# Patient Record
Sex: Male | Born: 1945 | Hispanic: No | Marital: Married
Health system: Southern US, Community
[De-identification: ages and names within clinical notes are randomized; demographics above are authoritative.]

## PROBLEM LIST (undated history)

## (undated) DIAGNOSIS — I4891 Unspecified atrial fibrillation: Secondary | ICD-10-CM

## (undated) DIAGNOSIS — N189 Chronic kidney disease, unspecified: Secondary | ICD-10-CM

## (undated) HISTORY — DX: Chronic kidney disease, unspecified: N18.9

## (undated) HISTORY — DX: Unspecified atrial fibrillation: I48.91

---

## 2010-08-01 ENCOUNTER — Encounter: Payer: Self-pay | Admitting: Cardiology

## 2010-08-01 ENCOUNTER — Inpatient Hospital Stay: Payer: Self-pay | Admitting: Internal Medicine

## 2010-08-01 ENCOUNTER — Encounter: Payer: Self-pay | Admitting: Cardiovascular Disease

## 2010-08-05 ENCOUNTER — Encounter: Payer: Self-pay | Admitting: Cardiovascular Disease

## 2010-08-06 ENCOUNTER — Encounter: Payer: Self-pay | Admitting: Cardiovascular Disease

## 2010-08-20 ENCOUNTER — Ambulatory Visit: Payer: Self-pay | Admitting: Cardiology

## 2010-08-20 DIAGNOSIS — I5022 Chronic systolic (congestive) heart failure: Secondary | ICD-10-CM

## 2010-08-20 DIAGNOSIS — I4891 Unspecified atrial fibrillation: Secondary | ICD-10-CM | POA: Insufficient documentation

## 2010-08-25 ENCOUNTER — Encounter: Payer: Self-pay | Admitting: Cardiovascular Disease

## 2010-08-25 ENCOUNTER — Ambulatory Visit: Payer: Self-pay | Admitting: Cardiology

## 2010-08-25 DIAGNOSIS — E785 Hyperlipidemia, unspecified: Secondary | ICD-10-CM | POA: Insufficient documentation

## 2010-08-25 LAB — CONVERTED CEMR LAB: POC INR: 2.3

## 2010-08-28 ENCOUNTER — Ambulatory Visit: Payer: Self-pay | Admitting: Cardiology

## 2010-08-28 ENCOUNTER — Ambulatory Visit: Payer: Self-pay | Admitting: Cardiovascular Disease

## 2010-08-28 ENCOUNTER — Encounter: Payer: Self-pay | Admitting: Cardiology

## 2010-09-02 LAB — CONVERTED CEMR LAB
ALT: 14 units/L (ref 0–53)
Albumin: 3.6 g/dL (ref 3.5–5.2)
Bilirubin, Direct: 0.2 mg/dL (ref 0.0–0.3)
CO2: 22 meq/L (ref 19–32)
Digitoxin Lvl: 2.5 ng/mL — ABNORMAL HIGH (ref 0.8–2.0)
Glucose, Bld: 92 mg/dL (ref 70–99)
Lymphocytes Relative: 39 % (ref 12–46)
Lymphs Abs: 2.9 10*3/uL (ref 0.7–4.0)
Neutro Abs: 3.9 10*3/uL (ref 1.7–7.7)
Neutrophils Relative %: 52 % (ref 43–77)
Platelets: 211 10*3/uL (ref 150–400)
Potassium: 4.5 meq/L (ref 3.5–5.3)
Sodium: 139 meq/L (ref 135–145)
TSH: 6.115 microintl units/mL — ABNORMAL HIGH (ref 0.350–4.500)
Total Bilirubin: 0.9 mg/dL (ref 0.3–1.2)
Total CHOL/HDL Ratio: 4.8
VLDL: 19 mg/dL (ref 0–40)
WBC: 7.5 10*3/uL (ref 4.0–10.5)

## 2010-09-04 ENCOUNTER — Ambulatory Visit: Payer: Self-pay | Admitting: Cardiology

## 2010-09-08 ENCOUNTER — Telehealth: Payer: Self-pay | Admitting: Cardiovascular Disease

## 2010-09-09 ENCOUNTER — Ambulatory Visit: Payer: Self-pay | Admitting: Cardiology

## 2010-09-15 ENCOUNTER — Telehealth: Payer: Self-pay | Admitting: Cardiovascular Disease

## 2010-09-15 ENCOUNTER — Ambulatory Visit: Payer: Self-pay | Admitting: Cardiology

## 2010-09-19 LAB — CONVERTED CEMR LAB
BUN: 18 mg/dL (ref 6–23)
Calcium: 9 mg/dL (ref 8.4–10.5)
Free T4: 0.78 ng/dL — ABNORMAL LOW (ref 0.80–1.80)
Potassium: 4.5 meq/L (ref 3.5–5.3)
Pro B Natriuretic peptide (BNP): 443.3 pg/mL — ABNORMAL HIGH (ref 0.0–100.0)
Sodium: 142 meq/L (ref 135–145)

## 2010-10-06 ENCOUNTER — Encounter: Payer: Self-pay | Admitting: Cardiovascular Disease

## 2010-10-06 ENCOUNTER — Ambulatory Visit: Payer: Self-pay | Admitting: Cardiology

## 2010-10-09 ENCOUNTER — Ambulatory Visit: Payer: Self-pay | Admitting: Cardiology

## 2010-10-09 DIAGNOSIS — R0602 Shortness of breath: Secondary | ICD-10-CM | POA: Insufficient documentation

## 2010-10-09 LAB — CONVERTED CEMR LAB: Free T4: 0.92 ng/dL (ref 0.80–1.80)

## 2010-10-22 ENCOUNTER — Telehealth (INDEPENDENT_AMBULATORY_CARE_PROVIDER_SITE_OTHER): Payer: Self-pay | Admitting: Radiology

## 2010-11-20 ENCOUNTER — Ambulatory Visit: Payer: Self-pay | Admitting: Cardiology

## 2010-12-23 NOTE — Letter (Signed)
Summary: External Other  External Other   Imported By: Harlon Flor 10/06/2010 13:48:09  _____________________________________________________________________  External Attachment:    Type:   Image     Comment:   External Document  Appended Document: External Other Isaac Leonard was in for blood work and informed me that he has stopped his Amiodarone because of the possible side effects, he has also let me know that he isn't taking his coumadin like he should because he "isn't sure about it". I did make sure he knew the risk and he did say that he has been told many times of these risk with not taking the coumadin.

## 2010-12-23 NOTE — Progress Notes (Signed)
Summary: MEDICATION   Phone Note Call from Patient Call back at [phone]   Caller: Patient Call For: [facility] Summary of Call: WOULD LIKE TO KNOW IF ENALIPRIL COULD MAKE HIM HAVE A HEADACHE Initial call taken by: Harlon Flor,  September 15, 2010 3:49 PM  Follow-up for Phone Call        Pt having headaches after taking imdur. Pt is going to not take the medication in the AM and see if his HA improves will call nurse back tomorrow. Benedict Needy, RN  September 15, 2010 5:01 PM   Pt is not having any more HA still taking imdur Follow-up by: Benedict Needy, RN,  September 17, 2010 4:50 PM

## 2010-12-23 NOTE — Letter (Signed)
Summary: [facility] - Echo  [facility] - Echo   Imported By: Marylou Mccoy 09/12/2010 15:15:30  _____________________________________________________________________  External Attachment:    Type:   Image     Comment:   External Document

## 2010-12-23 NOTE — Letter (Signed)
Summary: [facility] - Electrical Cardioversion Orders  [facility] - Electrical Cardioversion Orders   Imported By: Marylou Mccoy 10/15/2010 10:04:22  _____________________________________________________________________  External Attachment:    Type:   Image     Comment:   External Document  Appended Document: Cancelled Myoview Would call him next week and see if he would like to reschedule.  Can do it at [facility] if it would be easier (though better images if he could come to the [place] office).   Appended Document: Cancelled Myoview Called pt to see if he wanted to reschedule. LMOM TCB MES

## 2010-12-23 NOTE — Miscellaneous (Signed)
Summary: Coumadin Clinic  Clinical Lists Changes  Observations: Added new observation of INR POC: 2.3  (08/25/2010 9:41)      PCP: Victorino Dike Walker,M.D. INR POC 2.3  Dietary changes: no     Bleeding/hemorrhagic complications: no     Any changes in medication regimen? no     Any missed doses?: no       Is patient compliant with meds? yes

## 2010-12-23 NOTE — Letter (Signed)
Summary: [facility] Daily Progress Note   [facility] Daily Progress Note   Imported By: Roderic Ovens 09/15/2010 15:59:27  _____________________________________________________________________  External Attachment:    Type:   Image     Comment:   External Document

## 2010-12-23 NOTE — Assessment & Plan Note (Signed)
Summary: NP6/AMD   Visit Type:  Initial Consult Primary Provider:  [name].  CC:  c/o rapid heart  beats and shortness of breath with swelling in ankles..  History of Present Illness: 65 yo with recent admission for atrial fibrillation and acute systolic CHF presents to establish outpatient cardiology followup.  Patient has been spending almost all his time since 2/11 caring for his chronically ill wife.  She is on dialysis, unable to move on her own and morbidly obese.  He noted profound exhaustion beginning a few months ago (starting around 3/11), that he attributed to the difficulty of taking care of all his wife's needs.  He started getting short of breath with exertion.  This had a gradual onset and progression . He was eating very little and getting little sleep.  Beginning about 2 wks prior to admission, he noted the onset of significant lower extremity edema.  He noted his heart beating fast at least 2 wks prior to admission and thinks it could have been longer.  He was admitted earlier this month to [facility] with profound weakness, shortness of breath, and palpitations (the EMS driver who came to take his wife to dialysis insisted that he go to the ER).    In the ER, he was noted to be in atrial fibrillation with rapid response.  He was volume overloaded.  He was admitted and diuresed.  Rate was controlled with digoxin, Coreg, and amiodarone.  Echo showed EF < 25% with global hypokinesis and RV dysfunction.  Since discharge home, he is feeling better.  Orthopnea has resolved.  Appetite is returning.  He can walk about a block's distance now before becoming short of breath.  He is short of breath walking up 5 or 6 steps.  He has never had chest pain.   ECG: atrial fibrillation at 67, nonspecific ST-T changes (looks like digoxin effect)  Labs (9/11): HCT 45.9, plts 199, K 4.7, creatinine 1.45, digoxin 1.6  Current Medications (verified): 1)  Amiodarone Hcl 200 Mg Tabs (Amiodarone  Hcl) .... One Tablet Two Times A Day 2)  Aspir-Low 81 Mg Tbec (Aspirin) .... One Tablet Once Daily 3)  Carvedilol 6.25 Mg Tabs (Carvedilol) .... One Tablet Two Times A Day 4)  Coumadin 7.5 Mg Tabs (Warfarin Sodium) .... Tues, Thurs, Sat. Sun. 5)  Digoxin 0.25 Mg Tabs (Digoxin) .... One Tablet Once Daily 6)  Furosemide 40 Mg Tabs (Furosemide) .... One Tablet Once Daily 7)  Hydralazine Hcl 10 Mg Tabs (Hydralazine Hcl) .... One Tablet Once Daily 8)  Gax-X 9)  Coumadin 5 Mg Tabs (Warfarin Sodium) .... Mon, Wed, and Fri.  Allergies (verified): No Known Drug Allergies  Past History:  Past Medical History: 1. Atrial fibrillation: 1st noted in 9/11.  Now on coumadin.  2. Cardiomyopathy with systolic CHF: Uncertain etiology.  Patient was admitted in 9/11 with CHF exacerbation.  Echo (9/11): EF < 25%, severe global hypokinesis, RV severely dilated with moderate to severely decreased systolic function, mild MR (read by outside cardiologist).   3. CKD.   Past Surgical History: None  Family History: Reviewed history from 08/20/2010 and no changes required. 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  Lives in [place].  Has 1 son in [place].  [employer], was stationed in [place] Tobacco Use - No.  Alcohol Use - no  Review of Systems       All systems reviewed and  negative except as per HPI.   Vital Signs:  Patient profile:   65 year old male Height:      70 inches Weight:      235 pounds BMI:     33.84 Pulse rate:   79 / minute BP sitting:   129 / 93  (left arm) Cuff size:   large  Vitals Entered By: Bishop Dublin, CMA (August 20, 2010 11:20 AM)  Physical Exam  General:  Well developed, well nourished, in no acute distress. Head:  normocephalic and atraumatic Nose:  no deformity, discharge, inflammation, or lesions Mouth:  Teeth,  gums and palate normal. Oral mucosa normal. Neck:  Neck supple, JVP 10 cm. No masses, thyromegaly or abnormal cervical nodes. Lungs:  Clear bilaterally to auscultation and percussion. Heart:  Lateral PMI, chest non-tender; irregular rate and rhythm, S1, S2 without murmurs, rubs or gallops. Carotid upstroke normal, no bruit. Unable to palpate pedal pulses due to edema.  2+ edema to thighs bilaterally.  Abdomen:  Bowel sounds positive; abdomen soft but mildly distended.  Non-tender without masses, organomegaly, or hernias noted. No hepatosplenomegaly. Extremities:  No clubbing or cyanosis. Neurologic:  Alert and oriented x 3. Skin:  Intact without lesions or rashes. Psych:  Normal affect.   Impression & Recommendations:  Problem # 1:  SYSTOLIC HEART FAILURE, CHRONIC (ICD-428.22) Patient had echo showing EF < 25% with global hypokinesis and RV dysfunction.  The etiology of this cardiomyopathy is uncertain.  It certainly could be a tachycardia-mediated cardiomyopathy, with atrial fibrillation/RVR preceding diagnosis for an uncertain period of time.  This would fit with the lack of regional wall motion abnormalities.  Cannot rule out an ischemic cardiomyopathy, though no Qs on ECG and no history of chest pain.  He will need a catheterization eventually.  Could also have idiopathic dilated cardiomyopathy.  He is volume overloaded today, still with NYHA class III symptoms.   - I think that the first step will be cardioversion.  If this is tachycardia-mediated cardiomyopathy, this step will be vital.  He is on coumadin.  Will arrange for TEE-guided DCCV at [facility] next week.  - He is quite volume overloaded but will have to be very careful of creatinine.  Increase Lasix to 40 mg two times a day x 1 week, then decrease to 40 qam, 20 qpm.  Will need BMET in 1 week (will do on the day of DCCV).  - Stop hydralazine and start enalapril 2.5 mg two times a day (low dose).  As above, BMET in 1 week.  - Continue  current dose of Coreg.   - Decrease digoxin to 0.125 mg daily given elevated level.  - Will need left heart cath.  Will plan to do this 1 month after cardioversion when his coumadin can be held for cath.  Will have to follow creatinine carefully.   Problem # 2:  ATRIAL FIBRILLATION (ICD-427.31) Rate controlled atrial fibrillation today.  He is on warfarin.   - Ok to decrease amiodarone to 200 mg daily at this point.  Will get LFTs and TSH with labs next week.  Will need baseline PFTs as well.  - Plan TEE-guided DCCV next week.  Will come to office to get INR prior.  If INR low, will dose with Lovenox.    Other Orders: Trans Esophageal Echo Cardioversion (TEE-Cardioversion)  Patient Instructions: 1)  Your physician recommends that you schedule a follow-up appointment in: 2 weeks 2)  Your physician recommends that you return for a FASTING  lipid profile: OCT 4th (TSH/DIG/BMP/BNPLIP/LFT) 3)  Your physician has recommended you make the following change in your medication: START ENALAPRIL, DECREASE DIGOXIN STOP HYDRALAZINE INCREASE LASIX 4)  Your physician has requested that you have a TEE/Cardioversion.  During a TEE, sound waves are used to create images of your heart. It provides your doctor with information about the size and shape of your heart and how well your heart's chambers and valves are working. In this test, a transducer is attached to the end of a flexible tube that is guided down your throat and into your esophagus (the tube leading from your mouth to your stomach) to get a more detailed image of your heart. Once the TEE has determined that a blood clot is not present, the cardioversion begins.  Electrical cardioversion uses a jolt of electricity to your heart either through paddles or wired patches attached to your chest. This is a controlled, usually prescheduled, procedure. This procedure is done at the hospital and you are not awake during the procedure.  You usually go home the day of  the procedure. Please see the instruction sheet given to you today for more information. Prescriptions: ENALAPRIL MALEATE 2.5 MG TABS (ENALAPRIL MALEATE) Take one tablet by mouth twice a day  #60 x 6   Entered by:   Benedict Needy, RN   Authorized by:   Marca Ancona, MD   Signed by:   Benedict Needy, RN on 08/20/2010   Method used:   Electronically to        [id number] * (retail)       [address]       [place], [place]  [address]       Ph: [phone] or [phone]       Fax: [phone]   RxID:   [id number] FUROSEMIDE 40 MG TABS (FUROSEMIDE) Take 1 tablet by mouth in the AM and 1/2 tab in the afternoon  #45 x 6   Entered by:   Benedict Needy, RN   Authorized by:   Marca Ancona, MD   Signed by:   Benedict Needy, RN on 08/20/2010   Method used:   Electronically to        [id number] (retail)       [address]       [place], [place]  [address]       Ph: [phone] or [phone]       Fax: [phone]   RxID:   [id number] DIGOXIN 0.125 MG TABS (DIGOXIN) Take 1 tablet by mouth once a day  #30 x 4   Entered by:   Benedict Needy, RN   Authorized by:   Marca Ancona, MD   Signed by:   Benedict Needy, RN on 08/20/2010   Method used:   Electronically to        [id number] (retail)       [address]       [place], [place]  [address]       Ph: [phone] or [phone]       Fax: [phone]   RxID:   [id number]

## 2010-12-23 NOTE — Assessment & Plan Note (Signed)
Summary: POST CATH/AMD   Primary Provider:  [name].  CC:  f/u after cardioverson.  History of Present Illness: 65 yo with recent admission for atrial fibrillation and systolic CHF presents for followup.  Patient has been spending almost all his time since 2/11 caring for his chronically ill wife.  She is on dialysis, unable to move on her own and morbidly obese.  He noted profound exhaustion beginning a few months ago (starting around 3/11), that he attributed to the difficulty of taking care of all his wife's needs.  He started getting short of breath with exertion.  This had a gradual onset and progression . He was eating very little and getting little sleep.  Beginning about 2 wks prior to admission in 9/11, he noted the onset of significant lower extremity edema.  He noted his heart beating fast at least 2 wks prior to admission and thinks it could have been longer.  He was admitted earlier this month to [facility] with profound weakness, shortness of breath, and palpitations.   In the ER, he was noted to be in atrial fibrillation with rapid response.  He was volume overloaded.  He was admitted and diuresed.  Rate was controlled with digoxin, Coreg, and amiodarone.  Echo showed EF < 25% with global hypokinesis and RV dysfunction.    After discharge, I set him up for a TEE-guided cardioversion, which was done last week.  TEE did appear to show some improvement in LV systolic function with rate control (EF 35--40%).  He remains in NSR on amiodarone today.  I also restarted him on Lasix.  I stopped digoxin due to elevated level.  Since I last saw him, he has lost 8 lbs.  He is less short of breath.   He still has considerable lower extremity edema.  He is able to climb up his porch steps without problems now and can walk on flat ground without difficulty.  He weed-eated recently with mild dyspnea.    Labs (9/11): HCT 45.9, plts 199, K 4.7, creatinine 1.45, digoxin 1.6 Labs (10/11): HCT 43, K  4.5, creatinine 0.92, LFTs normal, TSH 6.1 (mild increase), LDL 101, HDL 32, BNP 562  ECG: NSR, nonspecific T wave flattening.   Current Medications (verified): 1)  Amiodarone Hcl 200 Mg Tabs (Amiodarone Hcl) .... Take 1 Tablet By Mouth Once A Day 2)  Aspir-Low 81 Mg Tbec (Aspirin) .... One Tablet Once Daily 3)  Carvedilol 6.25 Mg Tabs (Carvedilol) .... One Tablet Two Times A Day 4)  Coumadin 7.5 Mg Tabs (Warfarin Sodium) .... Tues, Thurs, Sat. Sun. 5)  Furosemide 40 Mg Tabs (Furosemide) .... Take 1/2  Tablet By Mouth in The Amn 6)  Gax-X 7)  Coumadin 5 Mg Tabs (Warfarin Sodium) .... Mon, Wed, and Fri. 8)  Enalapril Maleate 2.5 Mg Tabs (Enalapril Maleate) .... Take One Tablet By Mouth Twice A Day 9)  Isosorbide Mononitrate Cr 30 Mg Xr24h-Tab (Isosorbide Mononitrate) .... Take One Tablet By Mouth Daily  Allergies (verified): No Known Drug Allergies  Past History:  Past Surgical History: Last updated: 2010-09-02 None  Family History: Last updated: 02-Sep-2010 Mother: CHF deceased Father: Prostate Cancer;deceased  Social History: Last updated: September 02, 2010 Full Time--pharmaceutical validation engineer Married, wife is chronically ill and requires full-time care.  Lives in [place].  Has 1 son in [place].  [employer], was stationed in [place] Tobacco Use - No.  Alcohol Use - no  Risk Factors: Smoking Status: never (02-Sep-2010)  Past Medical History: 1. Atrial fibrillation:  1st noted in 9/11.  Now on coumadin.  Cardioverted to NSR (08/28/10).  2. Cardiomyopathy with systolic CHF: Uncertain etiology.  Could be tachycardia-mediated.  Patient was admitted in 9/11 with CHF exacerbation.  Echo (9/11): EF < 25%, severe global hypokinesis, RV severely dilated with moderate to severely decreased systolic function, mild MR (read by outside cardiologist).  TEE (10/11): EF 35-40%, no LAA thrombus, RV normal size with mildly decreased systolic function, mild MR.   3. CKD.   Family  History: Reviewed history from 08/20/2010 and no changes required. Mother: CHF deceased Father: Prostate Cancer;deceased  Social History: Reviewed history from 08/20/2010 and no changes required. Full Time--pharmaceutical Wellsite geologist Married, wife is chronically ill and requires full-time care.  Lives in [place].  Has 1 son in [place].  [employer], was stationed in [place] Tobacco Use - No.  Alcohol Use - no  Review of Systems       All systems reviewed and negative except as per HPI.   Vital Signs:  Patient profile:   65 year old male Height:      70 inches Weight:      227 pounds BMI:     32.69 Pulse rate:   54 / minute BP sitting:   126 / 84  (left arm) Cuff size:   large  Vitals Entered By: Benedict Needy, RN (September 04, 2010 2:44 PM)  Physical Exam  General:  Well developed, well nourished, in no acute distress. Neck:  Neck supple, JVP 10 cm. No masses, thyromegaly or abnormal cervical nodes. Lungs:  Clear bilaterally to auscultation and percussion. Heart:  Lateral PMI, chest non-tender; irregular rate and rhythm, S1, S2 without murmurs, rubs or gallops. Carotid upstroke normal, no bruit. Unable to palpate pedal pulses due to edema.  2+ edema to knees bilaterally.  Abdomen:  Bowel sounds positive; abdomen soft and non-tender without masses, organomegaly, or hernias noted. No hepatosplenomegaly. Extremities:  No clubbing or cyanosis. Neurologic:  Alert and oriented x 3. Psych:  Normal affect.   Impression & Recommendations:  Problem # 1:  ATRIAL FIBRILLATION (ICD-427.31) Patient is back in sinus rhythm after DCCV.  Good rate control.  He is taking amiodarone to maintain NSR, which I think is imperative.  He will need to continue coumadin for at least a month post-DVCCV.  On amiodarone, LFTs were normal but TSH was mildly elevated.  Will check free T4 and free T3.  Will need baseline PFTs on amiodarone and yearly eye exam.    Problem # 2:  SYSTOLIC  HEART FAILURE, CHRONIC (ICD-428.22) Patient is feeling better and has lost 8 lbs since last appointment.  He is still quite volume overoladed. EF was 35-40% by TEE (? improvement with rate control of atrial fbrillation).  I am going to increase his Lasix to 40 mg daily and will increase his enalapril to 5 mg two times a day.   Continue same Coreg dosing.  BMET/BNP in 10 days.  We are still not totally sure of the etiology of the patient's CHF.  It may have been tachycardia-mediated.  He also will need a cath to rule out coronary disease.  After he has been on coumadin for 4 wks poste-cardioversion, I will have him hold his coumadin for 5 days and will do a left heart cath.    Followup in 1 month.   Other Orders: Pulmonary Function Test (PFT)  Patient Instructions: 1)  Your physician recommends that you schedule a follow-up appointment in: 1 month  2)  Your physician recommends that you return for lab work in: 10 days (Free t3 and t4,BNP,BMP) 3)  Your physician has recommended you make the following change in your medication: INCREASE furosemide  and enalapril 4)  Your physician has recommended that you have a pulmonary function test.  Pulmonary Function Tests are a group of tests that measure how well air moves in and out of your lungs. Prescriptions: ENALAPRIL MALEATE 5 MG TABS (ENALAPRIL MALEATE) Take one tablet by mouth twice a day  #60 x 3   Entered by:   Benedict Needy, RN   Authorized by:   Marca Ancona, MD   Signed by:   Benedict Needy, RN on 09/04/2010   Method used:   Electronically to        [id number] ([facility]ail)       [address]       [place], [place]  [address]       Ph: [phone] or [phone]       Fax: [phone]   RxID:   [id number]

## 2010-12-23 NOTE — Letter (Signed)
Summary: Cardioversion/TEE Catering manager at [address]   [place], [place] [address]   Phone: [phone]  Fax: [phone]    Cardioversion / TEE Cardioversion Instructions  08/20/2010 MRN: [id number]  Isaac Leonard [address] [place], [place]  [address]  Dear [name], You are scheduled for a Cardioversion / TEE Cardioversion on OCTOBER 6TH with Dr. Mariah Milling.   Please arrive at the [facility]ICAL [facility]ALL AT [facility] at 6:30 a.m. on the day of your procedure.  1)   DIET:  A)   Nothing to eat or drink after midnight except your medications with a sip of water.    B)   Clear liquids include:  water, broth, Sprite, Ginger Ale, black coffee, tea (no sugar),      cranberry / grape / apple juice, jello (not red), popsicle from clear juices (not red).  2)   Come to the [place] office on OCTOBER 3RD AT 10:00 AM for lab work. You do not have to be fasting.  3)   MAKE SURE YOU TAKE YOUR COUMADIN.  4)YOU MAY TAKE ALL of your remaining medications with a small amount of water.  5)  Must have a responsible person to drive you home.  6)   Bring a current list of your medications and current insurance cards.   * Special Note:  Every effort is made to have your procedure done on time. Occasionally there are emergencies that present themselves at the hospital that may cause delays. Please be patient if a delay does occur.  * If you have any questions after you get home, please call the office at [phone].

## 2010-12-23 NOTE — Consult Note (Signed)
Summary[facility], [facility]   [facility]   Imported By: Roderic Ovens 09/15/2010 15:59:47  _____________________________________________________________________  External Attachment:    Type:   Image     Comment:   External Document

## 2010-12-23 NOTE — Assessment & Plan Note (Signed)
Summary: F/U 1 MONTH   Visit Type:  Follow-up Primary Isaac Leonard:  Isaac Leonard.  CC:  He is very concerned about taking the Amiodarone.  Denies chest pain or shortness of breath..  History of Present Illness: 65 yo with recent admission for atrial fibrillation and systolic CHF presents for followup.  Patient has been spending almost all his time since 2/11 caring for his chronically ill wife.  She is on dialysis, unable to move on her own and morbidly obese.  He noted profound exhaustion beginning a few months ago (starting around 3/11), that he attributed to the difficulty of taking care of all his wife's needs.  He started getting short of breath with exertion.  This had a gradual onset and progression . He was eating very little and getting little sleep.  Beginning about 2 wks prior to admission in 9/11, he noted the onset of significant lower extremity edema.  He noted his heart beating fast at least 2 wks prior to admission and thinks it could have been longer. He was admitted in 9/11 with systolic CHF and atrial fibrillation with rapid response.  Echo showed EF < 25% with global hypokinesis and RV dysfunction.    Since discharge from that admission, he has undergone TEE-guided cardioversion.  He was started on a regimen to treat CHF.  He remains in NSR today.  He has stopped amiodarone because of tremors that he developed in his hands.  He stopped coumadin because he remembers his wife having a hard time with it.  He has stopped Lasix because he is not longer short of breath.  In general, he is feeling quite good and thinks that he is back to normal.  He is walking, climbing stairs, etc without dyspnea, which is a considerable improvement for him.  He has never had chest pain.  Weight is down 5 lbs since last appointment.   Labs (9/11): HCT 45.9, plts 199, K 4.7, creatinine 1.45, digoxin 1.6 Labs (10/11): HCT 43, K 4.5, creatinine 0.92, LFTs normal, TSH 6.1 (mild increase), LDL 101, HDL 32,  BNP 562 => 443 Labs (11/11): Free T4 and TSH normal  ECG: NSR, normal  Current Medications (verified): 1)  Aspir-Low 81 Mg Tbec (Aspirin) .... One Tablet Once Daily 2)  Carvedilol 6.25 Mg Tabs (Carvedilol) .... One Tablet Two Times A Day 3)  Gax-X 4)  Enalapril Maleate 5 Mg Tabs (Enalapril Maleate) .... Take One Tablet By Mouth Twice A Day 5)  Isosorbide Mononitrate Cr 30 Mg Xr24h-Tab (Isosorbide Mononitrate) .... Take One Tablet By Mouth Daily  Allergies (verified): No Known Drug Allergies  Past History:  Past Surgical History: Last updated: 08/29/2010 None  Family History: Last updated: 08/29/2010 Mother: CHF deceased Father: Prostate Cancer;deceased  Social History: Last updated: 08-29-2010 Full Time--pharmaceutical validation engineer Married, wife is chronically ill and requires full-time care.  Lives in [place].  Has 1 son in [place].  [employer], was stationed in [place] Tobacco Use - No.  Alcohol Use - no  Risk Factors: Smoking Status: never (08-29-2010)  Past Medical History: Reviewed history from 09/04/2010 and no changes required. 1. Atrial fibrillation: 1st noted in 9/11.  Now on coumadin.  Cardioverted to NSR (08/28/10).  2. Cardiomyopathy with systolic CHF: Uncertain etiology.  Could be tachycardia-mediated.  Patient was admitted in 9/11 with CHF exacerbation.  Echo (9/11): EF < 25%, severe global hypokinesis, RV severely dilated with moderate to severely decreased systolic function, mild MR (read by outside cardiologist).  TEE (10/11): EF 35-40%,  no LAA thrombus, RV normal size with mildly decreased systolic function, mild MR.   3. CKD.   Family History: Reviewed history from 08/20/2010 and no changes required. Mother: CHF deceased Father: Prostate Cancer;deceased  Social History: Reviewed history from 08/20/2010 and no changes required. Full Time--pharmaceutical Wellsite geologist Married, wife is chronically ill and requires full-time care.   Lives in [place].  Has 1 son in [place].  [employer], was stationed in [place] Tobacco Use - No.  Alcohol Use - no  Review of Systems       All systems reviewed and negative except as per HPI.   Vital Signs:  Patient profile:   65 year old male Height:      70 inches Weight:      222 pounds BMI:     31.97 Pulse rate:   70 / minute BP sitting:   148 / 90  (left arm) Cuff size:   large  Vitals Entered By: Bishop Dublin, CMA (October 09, 2010 2:06 PM)  Physical Exam  General:  Well developed, well nourished, in no acute distress. Neck:  Neck supple, no JVD. No masses, thyromegaly or abnormal cervical nodes. Lungs:  Clear bilaterally to auscultation and percussion. Heart:  Lateral PMI, chest non-tender; regular rate and rhythm, S1, S2 without murmurs, rubs or gallops. Carotid upstroke normal, no bruit. Unable to palpate pedal pulses due to edema. 1+ edema 1/2 to knees bilaterally.  Abdomen:  Bowel sounds positive; abdomen soft and non-tender without masses, organomegaly, or hernias noted. No hepatosplenomegaly. Extremities:  No clubbing or cyanosis. Neurologic:  Alert and oriented x 3. Psych:  Normal affect.   Impression & Recommendations:  Problem # 1:  SYSTOLIC HEART FAILURE, CHRONIC (ICD-428.22) Patient is feeling better and has lost 5 lbs since last appointment.  Volume status appears better today with no JVD and at most NYHA class II symptoms.  His systolic dysfunction may have been tachycardia-mediated.  However, we have not ruled out CAD (thought never had chest pain).  I discussed catheterization again with him today.  He strongly wants to avoid invasive procedure.  Given his functional improvement, his presentation may have been due to tachycardia-mediated cardiomyopathy.  I will get a repeat TTE and an ETT-myoview.  If EF has made significant improvement and myoview shows no perfusion abnormalities, likely that this was tachy-mediated CMP.  If EF is still low or  there are perfusion abnormalities on myoview, he should have catheterization.   His volume status looks very good today and I agree with him that Lasix can be used on an as-needed basis.  He will weigh himself and take Lasix if he gains more than 2-3 lbs in a week.  Continue to watch salt.  I am also going to have him increase Coreg to 12.5 mg two times a day, continuing the same dose of enalapril.  If EF is still low on echo, will need to get him on spironolactone eventually.   Problem # 2:  ATRIAL FIBRILLATION (ICD-427.31) Patient remains in sinus rhythm after DCCV.  He has stopped amiodarone due to tremors and concerns about other possible side effects, which I think is reasonable.  However, I think that it is imperative to keep him out of atrial fibrillation if at all possible, as his systolic dysfunction may have been due to tachycardia-mediated cardiomyopathy.  If he has recurrence of atrial fibrillation, I would consider setting him up for atrial fibrillation ablation.  He will continue Coreg and agrees to restart on  coumadin for CVA prevention.   Other Orders: Nuclear Stress Test (Nuc Stress Test) Echocardiogram (Echo)  Patient Instructions: 1)  Your physician recommends that you schedule a follow-up appointment in:  1 month Dr Shirlee Latch,  1 week [facility] 2)  Your physician has recommended you make the following change in your medication: Increase Carvedilol to 12.5mg  two times a day, Restart Coumadin 5mg  once daily with an INR goal of 2.0-2.5, and Start Furosemide 20-40mg  once daily as needed for 2-3 lb weight gain. 3)  Your physician has requested that you have an echocardiogram.  Echocardiography is a painless test that uses sound waves to create images of your heart. It provides your doctor with information about the size and shape of your heart and how well your heart's chambers and valves are working.  This procedure takes approximately one hour. There are no restrictions for this  procedure. 4)  Your physician has requested that you have an exercise stress myoview.  For further information please visit [email or web address]  Please follow instruction sheet, as given. Prescriptions: CARVEDILOL 12.5 MG TABS (CARVEDILOL) Take one tablet by mouth twice a day  #60 x 6   Entered by:   Cloyde Reams RN   Authorized by:   Marca Ancona, MD   Signed by:   Cloyde Reams RN on 10/09/2010   Method used:   Electronically to        [id number] * (retail)       [address]       [place], [place]  [address]       Ph: [phone] or [phone]       Fax: [phone]   RxID:   [id number] FUROSEMIDE 20 MG TABS (FUROSEMIDE) Take 1-2  tablets by mouth daily as needed for 2-3 lb weight gain  #60 x 3   Entered by:   Cloyde Reams RN   Authorized by:   Marca Ancona, MD   Signed by:   Cloyde Reams RN on 10/09/2010   Method used:   Electronically to        [id number] * (retail)       [address]       [place], [place]  [address]       Ph: [phone] or [phone]       Fax: [phone]   RxID:   [id number]

## 2010-12-23 NOTE — Progress Notes (Signed)
Summary: RX  Phone Note Refill Request Call back at Home Phone [phone] Message from:  Patient on September 08, 2010 11:53 AM  Refills Requested: Medication #1:  CARVEDILOL 6.25 MG TABS one tablet two times a day  Medication #2:  ISOSORBIDE MONONITRATE CR 30 MG XR24H-TAB Take one tablet by mouth daily. Initial call taken by: Harlon Flor,  September 08, 2010 11:54 AM    Prescriptions: ISOSORBIDE MONONITRATE CR 30 MG XR24H-TAB (ISOSORBIDE MONONITRATE) Take one tablet by mouth daily  #30 x 6   Entered by:   Bishop Dublin, CMA   Authorized by:   Dossie Arbour MD   Signed by:   Bishop Dublin, CMA on 09/08/2010   Method used:   Electronically to        [id number] (retail)       [address]       [place], [place]  [address]       Ph: [phone] or [phone]       Fax: [phone]   RxID:   [id number] CARVEDILOL 6.25 MG TABS (CARVEDILOL) one tablet two times a day  #60 x 6   Entered by:   Bishop Dublin, CMA   Authorized by:   Dossie Arbour MD   Signed by:   Bishop Dublin, CMA on 09/08/2010   Method used:   Electronically to        [id number] (retail)       [address]       [place], [place]  [address]       Ph: [phone] or [phone]       Fax: [phone]   RxID:   [id number]

## 2010-12-23 NOTE — Letter (Signed)
[facility]   [facility]   Imported By: Roderic Ovens 09/15/2010 15:38:58  _____________________________________________________________________  External Attachment:    Type:   Image     Comment:   External Document

## 2010-12-25 NOTE — Progress Notes (Signed)
Summary: Cancelled Myoview  Phone Note Call from Patient   Caller: Patient Action Taken: Phone Call Completed Summary of Call: Isaac Leonard called and cancelled the Myoview sched for 12/08 due to health issues with his wife. He did not want to resched at this time. Initial call taken by: Leonia Corona, RT-N,  October 22, 2010 1:28 PM     Appended Document: Cancelled Myoview Would call him next week and see if he would like to reschedule.  Can do it at [facility] if it would be easier (though better images if he could come to the [place] office).   Appended Document: Cancelled Myoview Called pt to see if he wanted to reschedule. LMOM TCB MES  Appended Document: Cancelled Myoview Attempted to call pt again, LMOM TCB /MES

## 2010-12-25 NOTE — Letter (Signed)
Summary: [facility]-Transesophageal Echocardiogram Orders  [facility]   Imported By: Marylou Mccoy 10/15/2010 10:36:08  _____________________________________________________________________  External Attachment:    Type:   Image     Comment:   External Document

## 2010-12-25 NOTE — Miscellaneous (Signed)
Summary: [facility]/ Consent for TEE  [facility]/Echo   Imported By: Lenard Forth 11/19/2010 13:07:58  _____________________________________________________________________  External Attachment:    Type:   Image     Comment:   External Document

## 2011-06-07 ENCOUNTER — Other Ambulatory Visit: Payer: Self-pay | Admitting: Cardiology

## 2011-06-16 ENCOUNTER — Encounter: Payer: Self-pay | Admitting: Cardiology

## 2011-12-10 ENCOUNTER — Other Ambulatory Visit: Payer: Self-pay | Admitting: Cardiology

## 2012-05-11 ENCOUNTER — Inpatient Hospital Stay: Payer: Self-pay | Admitting: Internal Medicine

## 2012-05-11 LAB — URINALYSIS, COMPLETE
Bilirubin,UR: NEGATIVE
Glucose,UR: 50 mg/dL (ref 0–75)
Protein: >=500
RBC,UR: 2 /HPF (ref 0–5)
Specific Gravity: 1.018 (ref 1.003–1.030)
Squamous Epithelial: NONE SEEN
WBC UR: 84 /HPF (ref 0–5)

## 2012-05-11 LAB — CBC
HCT: 49.3 % (ref 40.0–52.0)
Platelet: 155 10*3/uL (ref 150–440)
WBC: 14.4 10*3/uL — ABNORMAL HIGH (ref 3.8–10.6)

## 2012-05-11 LAB — CK TOTAL AND CKMB (NOT AT ARMC): CK-MB: 1.7 ng/mL (ref 0.5–3.6)

## 2012-05-11 LAB — COMPREHENSIVE METABOLIC PANEL
Albumin: 4 g/dL (ref 3.4–5.0)
BUN: 21 mg/dL — ABNORMAL HIGH (ref 7–18)
Bilirubin,Total: 2.2 mg/dL — ABNORMAL HIGH (ref 0.2–1.0)
Calcium, Total: 8.9 mg/dL (ref 8.5–10.1)
EGFR (Non-African Amer.): 41 — ABNORMAL LOW
Glucose: 163 mg/dL — ABNORMAL HIGH (ref 65–99)
Osmolality: 284 (ref 275–301)
SGOT(AST): 38 U/L — ABNORMAL HIGH (ref 15–37)
SGPT (ALT): 38 U/L
Total Protein: 7.1 g/dL (ref 6.4–8.2)

## 2012-05-11 LAB — PROTIME-INR: Prothrombin Time: 14.7 secs (ref 11.5–14.7)

## 2012-05-12 DIAGNOSIS — I4891 Unspecified atrial fibrillation: Secondary | ICD-10-CM

## 2012-05-12 LAB — LIPID PANEL
Cholesterol: 171 mg/dL (ref 0–200)
HDL Cholesterol: 28 mg/dL — ABNORMAL LOW (ref 40–60)
Ldl Cholesterol, Calc: 125 mg/dL — ABNORMAL HIGH (ref 0–100)
Triglycerides: 90 mg/dL (ref 0–200)

## 2012-05-12 LAB — TROPONIN I
Troponin-I: 0.39 ng/mL — ABNORMAL HIGH
Troponin-I: 0.42 ng/mL — ABNORMAL HIGH

## 2012-05-12 LAB — HEMOGLOBIN A1C: Hemoglobin A1C: 6.3 % (ref 4.2–6.3)

## 2012-05-12 LAB — DIGOXIN LEVEL: Digoxin: <0.06 ng/mL

## 2012-05-13 LAB — BASIC METABOLIC PANEL
Anion Gap: 9 (ref 7–16)
BUN: 20 mg/dL — ABNORMAL HIGH (ref 7–18)
Calcium, Total: 8.5 mg/dL (ref 8.5–10.1)
Chloride: 106 mmol/L (ref 98–107)
EGFR (African American): 53 — ABNORMAL LOW
EGFR (Non-African Amer.): 46 — ABNORMAL LOW
Glucose: 143 mg/dL — ABNORMAL HIGH (ref 65–99)
Osmolality: 284 (ref 275–301)
Sodium: 140 mmol/L (ref 136–145)

## 2012-05-13 LAB — LIPID PANEL
Cholesterol: 169 mg/dL (ref 0–200)
HDL Cholesterol: 29 mg/dL — ABNORMAL LOW (ref 40–60)
Triglycerides: 99 mg/dL (ref 0–200)
VLDL Cholesterol, Calc: 20 mg/dL (ref 5–40)

## 2012-05-13 LAB — MAGNESIUM: Magnesium: 2 mg/dL

## 2012-05-13 LAB — PHOSPHORUS: Phosphorus: 2.6 mg/dL (ref 2.5–4.9)

## 2012-05-14 LAB — CBC WITH DIFFERENTIAL/PLATELET
Eosinophil #: 0 10*3/uL (ref 0.0–0.7)
Eosinophil %: 0.3 %
HGB: 14.9 g/dL (ref 13.0–18.0)
Lymphocyte #: 2.7 10*3/uL (ref 1.0–3.6)
MCH: 29.1 pg (ref 26.0–34.0)
MCHC: 33.2 g/dL (ref 32.0–36.0)
MCV: 88 fL (ref 80–100)
Monocyte %: 7.6 %
Neutrophil #: 8.5 10*3/uL — ABNORMAL HIGH (ref 1.4–6.5)
Neutrophil %: 69.4 %
Platelet: 137 10*3/uL — ABNORMAL LOW (ref 150–440)
RDW: 15.1 % — ABNORMAL HIGH (ref 11.5–14.5)

## 2012-05-14 LAB — BASIC METABOLIC PANEL
BUN: 23 mg/dL — ABNORMAL HIGH (ref 7–18)
Co2: 25 mmol/L (ref 21–32)
Creatinine: 1.86 mg/dL — ABNORMAL HIGH (ref 0.60–1.30)
EGFR (African American): 43 — ABNORMAL LOW
EGFR (Non-African Amer.): 37 — ABNORMAL LOW
Glucose: 82 mg/dL (ref 65–99)
Potassium: 3.4 mmol/L — ABNORMAL LOW (ref 3.5–5.1)

## 2012-05-14 LAB — PROTIME-INR: INR: 1.2

## 2012-05-15 LAB — CK TOTAL AND CKMB (NOT AT ARMC): CK, Total: 1896 U/L — ABNORMAL HIGH (ref 35–232)

## 2012-05-15 LAB — PROTIME-INR: INR: 1.2

## 2012-05-16 ENCOUNTER — Other Ambulatory Visit (HOSPITAL_COMMUNITY): Payer: Medicare Other

## 2012-05-16 ENCOUNTER — Ambulatory Visit (HOSPITAL_COMMUNITY)
Admission: AD | Admit: 2012-05-16 | Discharge: 2012-05-16 | Disposition: A | Discharge status: Home / Self Care | Payer: Self-pay | Source: Other Acute Inpatient Hospital | Attending: Internal Medicine | Admitting: Internal Medicine

## 2012-05-16 ENCOUNTER — Inpatient Hospital Stay
Admission: RE | Admit: 2012-05-16 | Discharge: 2012-06-27 | Disposition: A | Discharge status: Skilled Nursing Facility | Payer: Medicare Other | Source: Ambulatory Visit | Attending: Internal Medicine | Admitting: Internal Medicine

## 2012-05-16 DIAGNOSIS — I509 Heart failure, unspecified: Secondary | ICD-10-CM | POA: Insufficient documentation

## 2012-05-16 DIAGNOSIS — I5022 Chronic systolic (congestive) heart failure: Secondary | ICD-10-CM

## 2012-05-16 DIAGNOSIS — I6350 Cerebral infarction due to unspecified occlusion or stenosis of unspecified cerebral artery: Secondary | ICD-10-CM | POA: Insufficient documentation

## 2012-05-16 DIAGNOSIS — A419 Sepsis, unspecified organism: Secondary | ICD-10-CM

## 2012-05-16 DIAGNOSIS — N390 Urinary tract infection, site not specified: Secondary | ICD-10-CM

## 2012-05-16 DIAGNOSIS — R6521 Severe sepsis with septic shock: Secondary | ICD-10-CM

## 2012-05-16 DIAGNOSIS — E785 Hyperlipidemia, unspecified: Secondary | ICD-10-CM

## 2012-05-16 DIAGNOSIS — J9600 Acute respiratory failure, unspecified whether with hypoxia or hypercapnia: Secondary | ICD-10-CM | POA: Insufficient documentation

## 2012-05-16 DIAGNOSIS — J811 Chronic pulmonary edema: Secondary | ICD-10-CM

## 2012-05-16 DIAGNOSIS — R0602 Shortness of breath: Secondary | ICD-10-CM

## 2012-05-16 DIAGNOSIS — R0902 Hypoxemia: Secondary | ICD-10-CM

## 2012-05-16 DIAGNOSIS — J9620 Acute and chronic respiratory failure, unspecified whether with hypoxia or hypercapnia: Secondary | ICD-10-CM

## 2012-05-16 DIAGNOSIS — I4891 Unspecified atrial fibrillation: Secondary | ICD-10-CM

## 2012-05-16 LAB — BLOOD GAS, ARTERIAL
Acid-base deficit: 4.1 mmol/L — ABNORMAL HIGH (ref 0.0–2.0)
FIO2: 45 %
MECHVT: 550 mL
O2 Saturation: 97.6 %
PEEP: 5 cmH2O
Patient temperature: 98.6
RATE: 12 resp/min
pO2, Arterial: 107 mmHg — ABNORMAL HIGH (ref 80.0–100.0)

## 2012-05-16 LAB — COMPREHENSIVE METABOLIC PANEL
ALT: 34 U/L (ref 0–53)
Alkaline Phosphatase: 126 U/L — ABNORMAL HIGH (ref 39–117)
CO2: 22 mEq/L (ref 19–32)
GFR calc Af Amer: 52 mL/min — ABNORMAL LOW (ref >90)
GFR calc non Af Amer: 45 mL/min — ABNORMAL LOW (ref >90)
Glucose, Bld: 168 mg/dL — ABNORMAL HIGH (ref 70–99)
Potassium: 4.4 mEq/L (ref 3.5–5.1)
Sodium: 140 mEq/L (ref 135–145)

## 2012-05-16 LAB — URINE CULTURE

## 2012-05-16 LAB — DIFFERENTIAL
Lymphocytes Relative: 15 % (ref 12–46)
Lymphs Abs: 1.7 10*3/uL (ref 0.7–4.0)
Neutro Abs: 9.2 10*3/uL — ABNORMAL HIGH (ref 1.7–7.7)
Neutrophils Relative %: 77 % (ref 43–77)

## 2012-05-16 LAB — CBC
Platelets: 153 10*3/uL (ref 150–400)
RBC: 5.4 MIL/uL (ref 4.22–5.81)
WBC: 11.9 10*3/uL — ABNORMAL HIGH (ref 4.0–10.5)

## 2012-05-16 LAB — BASIC METABOLIC PANEL
Anion Gap: 11 (ref 7–16)
BUN: 28 mg/dL — ABNORMAL HIGH (ref 7–18)
Creatinine: 2.16 mg/dL — ABNORMAL HIGH (ref 0.60–1.30)
EGFR (African American): 36 — ABNORMAL LOW
EGFR (Non-African Amer.): 31 — ABNORMAL LOW
Glucose: 172 mg/dL — ABNORMAL HIGH (ref 65–99)
Osmolality: 295 (ref 275–301)
Potassium: 3.4 mmol/L — ABNORMAL LOW (ref 3.5–5.1)
Sodium: 143 mmol/L (ref 136–145)

## 2012-05-16 LAB — CK TOTAL AND CKMB (NOT AT ARMC): CK, Total: 1603 U/L — ABNORMAL HIGH (ref 35–232)

## 2012-05-16 LAB — PHOSPHORUS: Phosphorus: 3.3 mg/dL (ref 2.5–4.9)

## 2012-05-16 LAB — DIGOXIN LEVEL: Digoxin: 0.78 ng/mL

## 2012-05-16 LAB — MAGNESIUM: Magnesium: 2 mg/dL

## 2012-05-16 LAB — PROTIME-INR: Prothrombin Time: 17.5 seconds — ABNORMAL HIGH (ref 11.6–15.2)

## 2012-05-17 ENCOUNTER — Other Ambulatory Visit (HOSPITAL_COMMUNITY): Payer: Medicare Other

## 2012-05-17 ENCOUNTER — Institutional Professional Consult (permissible substitution) (HOSPITAL_COMMUNITY): Payer: Medicare Other

## 2012-05-17 DIAGNOSIS — R6521 Severe sepsis with septic shock: Secondary | ICD-10-CM

## 2012-05-17 DIAGNOSIS — R6520 Severe sepsis without septic shock: Secondary | ICD-10-CM

## 2012-05-17 DIAGNOSIS — R0902 Hypoxemia: Secondary | ICD-10-CM

## 2012-05-17 DIAGNOSIS — J9620 Acute and chronic respiratory failure, unspecified whether with hypoxia or hypercapnia: Secondary | ICD-10-CM

## 2012-05-17 DIAGNOSIS — J811 Chronic pulmonary edema: Secondary | ICD-10-CM

## 2012-05-17 DIAGNOSIS — N390 Urinary tract infection, site not specified: Secondary | ICD-10-CM

## 2012-05-17 DIAGNOSIS — I4891 Unspecified atrial fibrillation: Secondary | ICD-10-CM

## 2012-05-17 DIAGNOSIS — A419 Sepsis, unspecified organism: Secondary | ICD-10-CM

## 2012-05-17 LAB — BASIC METABOLIC PANEL
GFR calc non Af Amer: 45 mL/min — ABNORMAL LOW (ref >90)
Glucose, Bld: 190 mg/dL — ABNORMAL HIGH (ref 70–99)
Potassium: 4.5 mEq/L (ref 3.5–5.1)
Sodium: 138 mEq/L (ref 135–145)

## 2012-05-17 LAB — URINALYSIS, ROUTINE W REFLEX MICROSCOPIC
Bilirubin Urine: NEGATIVE
Glucose, UA: NEGATIVE mg/dL
Specific Gravity, Urine: 1.024 (ref 1.005–1.030)
Urobilinogen, UA: 0.2 mg/dL (ref 0.0–1.0)

## 2012-05-17 LAB — CULTURE, BLOOD (SINGLE)

## 2012-05-17 LAB — PROTIME-INR: INR: 1.46 (ref 0.00–1.49)

## 2012-05-17 LAB — URINE MICROSCOPIC-ADD ON

## 2012-05-17 LAB — EXPECTORATED SPUTUM ASSESSMENT W GRAM STAIN, RFLX TO RESP C

## 2012-05-17 LAB — PREALBUMIN: Prealbumin: 6.2 mg/dL — ABNORMAL LOW (ref 17.0–34.0)

## 2012-05-17 LAB — MAGNESIUM: Magnesium: 2 mg/dL (ref 1.5–2.5)

## 2012-05-17 NOTE — Consult Note (Signed)
Name: Isaac Leonard MRN: [id number] DOB: [birth year]    LOS: 1  Referring Provider:  [facility] Reason for Referral: [facility]  PULMONARY / CRITICAL CARE MEDICINE  HPI:  66 yo wm who was found in parking lot 05/06/12 unresponsive and transported to [facility]. He was notable for Rt hemiplegia, Lt Thalamic infarct and required intubation to protect airway. He self extubated but could not protect his airway. He has known Artrial fibrillation on coumadin but INR was sub therapeutic on admit to [facility] and stroke was presumed embolic. He became septic with enterococcus UTI and required pressors and abx. Transferred to [facility] 6/24 on full ventilatory support via oral ET. PCCM asked to evaluate and manage ventilator.      Past Medical History  Diagnosis Date  . Atrial fibrillation     1st noted in 9/11. Now on coumadin. Cardioverted to NSR (08/28/10)  . Cardiomyopathy     with systolic CHF: uincertain etiolgogy. Could be tachycardia-mediated. Pt was admitted in 9/11 with CHF exacerbation Echo (9/11) EF < 25% severe global hypokinesis, RV severely dilated with moderate to severely decreased systolic function, mild MR (read by outside cardiologist). TEe (10/11): EF 35-40%, no LAA thrombus, RV normal size with mildly decreased systolic function, mild MR  . CKD (chronic kidney disease)    No past surgical history on file. Prior to Admission medications   Medication Sig Start Date End Date Taking? Authorizing Provider  aspirin (ASPIR-LOW) 81 MG EC tablet Take 81 mg by mouth daily.      Historical Provider, MD  carvedilol (COREG) 12.5 MG tablet TAKE ONE TABLET BY MOUTH TWICE A DAY 06/07/11   Laurey Morale, MD  enalapril (VASOTEC) 5 MG tablet TAKE ONE TABLET BY MOUTH TWICE A DAY 12/10/11   Rollene Rotunda, MD  furosemide (LASIX) 20 MG tablet Take 20 mg by mouth. Take 1-2 tabs by mouth daily as needed for 2-3 lb weight gain     Historical Provider, MD  isosorbide mononitrate (IMDUR) 30 MG 24 hr tablet Take 30 mg by mouth daily.       Historical Provider, MD  Simethicone (GAS-X PO) Take by mouth.      Historical Provider, MD  warfarin (COUMADIN) 5 MG tablet Take 5 mg by mouth daily. Use as directed by Anticoagulation clinic     Historical Provider, MD   Allergies None Family History Family History  Problem Relation Age of Onset  . Heart failure Mother   . Prostate cancer Father    Social History  has an unknown smoking status. He does not have any smokeless tobacco history on file. He reports that he does not drink alcohol. His drug history not on file.  Review Of Systems:  na  Brief patient description:   Remains vent dependent.    Current Status: Remains vent dependent.  Vital Signs:  Vital signs reviewed. Abnormal values will appear under impression plan section.    Physical Examination: General:  Obese male Neuro:  No follows commands. Rt hemiplegia. Lt side strong HEENT:  PERL, no track or follow Neck:no JVD Cardiovascular:  hsd Lungs:  Coarse rhonchi Abdomen:  Obese + bs Musculoskeletal:  intact Skin:  Warm + lower ext edema  Vent: AC/VC 12/+18 Fio2 28% Vt 550 Peep 5 Ct Head Wo Contrast  05/17/2012  *RADIOLOGY REPORT*  Clinical Data: Stroke  CT HEAD WITHOUT CONTRAST  Technique:  Contiguous axial images were obtained from the base of the skull through the vertex without contrast.  Comparison: None  Findings:  Normal ventricular morphology. No midline shift. Large area of low attenuation identified at the left basal ganglia and posterior limb of the left internal capsule compatible with evolving infarct. Question minor involvement of anterior left thalamus by infarct. Scattered small vessel chronic ischemic changes of deep cerebral white matter. No intracranial hemorrhage or mass lesion identified. Scattered beam hardening artifacts at skull base. No extra-axial fluid collections. Mucosal thickening scattered within the paranasal sinuses. Mastoid air cells clear. Bones unremarkable.  IMPRESSION:  Large area of infarction identified at left basal ganglia and posterior limb of left internal capsule. Small vessel chronic ischemic changes of deep cerebral white matter.  Original Report Authenticated By: Lollie Marrow, M.D.   Dg Chest Port 1 View  05/17/2012  *RADIOLOGY REPORT*  Clinical Data: Intubation.  Follow up of CHF.  PORTABLE CHEST - 1 VIEW  Comparison: 05/16/2012  Findings: Endotracheal tube terminates borderline high, 6.9 cm above carina.  Nasogastric tube extends beyond the  inferior aspect of the film.  Right IJ central line unchanged.  Cardiomegaly.  Left costophrenic angle excluded.  No right and no definite left pleural effusion. No pneumothorax.  No congestive failure.  Mild subsegmental atelectasis at the left lung base.  IMPRESSION:  1. Cardiomegaly without congestive failure. 2.  Borderline high position of endotracheal tube.  This could be advanced 3 cm or reevaluated on follow-up.  Original Report Authenticated By: Consuello Bossier, M.D.   Dg Chest Port 1 View  05/16/2012  *RADIOLOGY REPORT*  Clinical Data: Line placement  PORTABLE CHEST - 1 VIEW  Comparison: None.  Findings: Right IJ central line tip in the mid SVC.  Endotracheal tube 8.5 cm above the carina.  Cardiomegaly noted with slight vascular and interstitial prominence.  Minimal basilar atelectasis. No large effusion or pneumothorax.  IMPRESSION: Right IJ central line tip mid SVC.  No pneumothorax  Cardiomegaly without CHF  Basilar atelectasis  Original Report Authenticated By: Judie Petit. Ruel Favors, M.D.    Lab 05/17/12 0640 05/16/12 2035  NA 138 140  K 4.5 4.4  CL 104 106  CO2 20 22  BUN 20 22  CREATININE 1.56* 1.55*  GLUCOSE 190* 168*    Lab 05/16/12 2035  HGB 15.9  HCT 46.1  WBC 11.9*  PLT 153    ABG    Component Value Date/Time   PHART 7.405 05/16/2012 1932   PCO2ART 32.1* 05/16/2012 1932   PO2ART 107.0* 05/16/2012 1932   HCO3 19.7* 05/16/2012 1932   TCO2 20.7 05/16/2012 1932   ACIDBASEDEF 4.1* 05/16/2012  1932   O2SAT 97.6 05/16/2012 1932    ASSESSMENT AND PLAN [facility]DR[facility] secondary to Embolic Stroke, Rt hemiplegia, AMS -Vent as ordered -attempt to wean but very low threshold for tracheostomy. Failed self extubation at [facility]. -Will allow the next two days to evaluate for weaning, if unsuccessful (which is very unlikely) will trach on Friday.   -Dr. Zenda Alpers will speak with the family regarding comfort care versus tracheostomy placement.  Embolic stroke with sub therapeutic INR -Neuro exam is very poor.  Would be appropriate to call neuro for prognostication.   UTI with resolved sepsis -Agree with abx use, now patient is off pressors.  Cardiomyopathy -Echo pending.  Would hold off diureses for now since patient's BP is questionable.  Brett Canales Minor ACNP Adolph Pollack PCCM Pager [phone] till 3 pm If no answer page [phone] 05/17/2012, 1:08 PM  Patient seen and examined, agree with above note.  I dictated the care and orders written for this patient  under my direction.  Koren Bound, M.D. [phone]

## 2012-05-18 ENCOUNTER — Other Ambulatory Visit (HOSPITAL_COMMUNITY): Payer: Medicare Other

## 2012-05-18 DIAGNOSIS — Z9911 Dependence on respirator [ventilator] status: Secondary | ICD-10-CM

## 2012-05-18 LAB — IRON AND TIBC
Iron: 23 ug/dL — ABNORMAL LOW (ref 42–135)
Saturation Ratios: 13 % — ABNORMAL LOW (ref 20–55)
TIBC: 184 ug/dL — ABNORMAL LOW (ref 215–435)
UIBC: 161 ug/dL (ref 125–400)

## 2012-05-18 LAB — DIFFERENTIAL
Basophils Absolute: 0 10*3/uL (ref 0.0–0.1)
Basophils Relative: 0 % (ref 0–1)
Eosinophils Absolute: 0.2 10*3/uL (ref 0.0–0.7)
Eosinophils Relative: 2 % (ref 0–5)
Monocytes Absolute: 0.9 10*3/uL (ref 0.1–1.0)

## 2012-05-18 LAB — CBC
HCT: 42.3 % (ref 39.0–52.0)
MCH: 28.5 pg (ref 26.0–34.0)
MCHC: 32.9 g/dL (ref 30.0–36.0)
MCV: 86.7 fL (ref 78.0–100.0)
Platelets: 203 10*3/uL (ref 150–400)
RDW: 15 % (ref 11.5–15.5)
WBC: 12.7 10*3/uL — ABNORMAL HIGH (ref 4.0–10.5)

## 2012-05-18 LAB — BASIC METABOLIC PANEL
BUN: 19 mg/dL (ref 6–23)
Creatinine, Ser: 1.41 mg/dL — ABNORMAL HIGH (ref 0.50–1.35)
GFR calc Af Amer: 58 mL/min — ABNORMAL LOW (ref >90)
GFR calc non Af Amer: 50 mL/min — ABNORMAL LOW (ref >90)
Glucose, Bld: 210 mg/dL — ABNORMAL HIGH (ref 70–99)
Potassium: 4.2 mEq/L (ref 3.5–5.1)

## 2012-05-18 LAB — URINE CULTURE
Colony Count: 9000
Culture  Setup Time: 201306251900

## 2012-05-18 LAB — FERRITIN: Ferritin: 1098 ng/mL — ABNORMAL HIGH (ref 22–322)

## 2012-05-18 NOTE — Progress Notes (Addendum)
Name: Isaac Leonard MRN: [id number] DOB: 08/20/1946    LOS: 2  Referring Provider:  [facility] Reason for Referral: [facility]  PULMONARY / CRITICAL CARE MEDICINE  HPI:  66 yo wm who was found in parking lot 05/06/12 unresponsive and transported to [facility]. He was notable for Rt hemiplegia, Lt Thalamic infarct and required intubation to protect airway. He self extubated but could not protect his airway. He has known Artrial fibrillation on coumadin but INR was sub therapeutic on admit to [facility] and stroke was presumed embolic. He became septic with enterococcus UTI and required pressors and abx. Transferred to [facility] 6/24 on full ventilatory support via oral ET. PCCM asked to evaluate and manage ventilator.     Brief patient description:   Remains vent dependent.    Current Status: Remains vent dependent.  Vital Signs:  Vital signs reviewed. Abnormal values will appear under impression plan section.    Physical Examination: General:  Obese male Neuro:  No follows commands. Rt hemiplegia. Lt side strong HEENT:  PERL, no track or follow Neck:no JVD Cardiovascular:  hsd Lungs:  Coarse rhonchi Abdomen:  Obese + bs Musculoskeletal:  intact Skin:  Warm + lower ext edema  Vent: AC/VC 12/+22 Fio2 28% Vt 550 Peep 5 Ct Head Wo Contrast  05/17/2012  *RADIOLOGY REPORT*  Clinical Data: Stroke  CT HEAD WITHOUT CONTRAST  Technique:  Contiguous axial images were obtained from the base of the skull through the vertex without contrast.  Comparison: None  Findings: Normal ventricular morphology. No midline shift. Large area of low attenuation identified at the left basal ganglia and posterior limb of the left internal capsule compatible with evolving infarct. Question minor involvement of anterior left thalamus by infarct. Scattered small vessel chronic ischemic changes of deep cerebral white matter. No intracranial hemorrhage or mass lesion identified. Scattered beam hardening artifacts at skull base. No extra-axial  fluid collections. Mucosal thickening scattered within the paranasal sinuses. Mastoid air cells clear. Bones unremarkable.  IMPRESSION: Large area of infarction identified at left basal ganglia and posterior limb of left internal capsule. Small vessel chronic ischemic changes of deep cerebral white matter.  Original Report Authenticated By: Lollie Marrow, M.D.   Dg Chest Port 1 View  05/18/2012  *RADIOLOGY REPORT*  Clinical Data: Intubation.  PORTABLE CHEST - 1 VIEW  Comparison: 05/17/2012.  Findings: Endotracheal tube is in satisfactory position. Nasogastric tube is followed into the stomach.  Right IJ central line tip projects over the SVC.  Heart size stable.  Lungs are somewhat low in volume with mild bibasilar atelectasis.  No pleural fluid.  IMPRESSION: Mild bibasilar atelectasis.  Original Report Authenticated By: Reyes Ivan, M.D.   Dg Chest Portable 1 View  05/17/2012  *RADIOLOGY REPORT*  Clinical Data: Advancement of endotracheal tube and addition of OG tube.  PORTABLE CHEST - 1 VIEW  Comparison: 05/17/2012; 05/16/2012  Findings:  Grossly unchanged borderline enlarged cardiac silhouette and mediastinal contours.  Endotracheal tube tip again overlies the tracheal air column, approximately 9 cm above the carina.  Interval placement of enteric tube with tip terminating inferior to the left hemidiaphragm.  Unchanged positioning of right jugular approach central venous catheter with tip overlying the distal SVC. Pulmonary vasculature remains indistinct with cephalization of flow.  There is persistent mild elevation of the right hemidiaphragm.  Grossly unchanged basilar opacities.  No definite pleural effusion or pneumothorax.  Unchanged bones.  IMPRESSION: 1.  Grossly unchanged positioning of endotracheal tube with tip approximately 9 cm above the carina.  No pneumothorax. 2.  Interval placement of enteric tube, terminating inferior to the left hemidiaphragm. 3.  Suspect mild pulmonary edema.   Original Report Authenticated By: Waynard Reeds, M.D.   Dg Chest Port 1 View  05/17/2012  *RADIOLOGY REPORT*  Clinical Data: Intubation.  Follow up of CHF.  PORTABLE CHEST - 1 VIEW  Comparison: 05/16/2012  Findings: Endotracheal tube terminates borderline high, 6.9 cm above carina.  Nasogastric tube extends beyond the  inferior aspect of the film.  Right IJ central line unchanged.  Cardiomegaly.  Left costophrenic angle excluded.  No right and no definite left pleural effusion. No pneumothorax.  No congestive failure.  Mild subsegmental atelectasis at the left lung base.  IMPRESSION:  1. Cardiomegaly without congestive failure. 2.  Borderline high position of endotracheal tube.  This could be advanced 3 cm or reevaluated on follow-up.  Original Report Authenticated By: Consuello Bossier, M.D.   Dg Chest Port 1 View  05/16/2012  *RADIOLOGY REPORT*  Clinical Data: Line placement  PORTABLE CHEST - 1 VIEW  Comparison: None.  Findings: Right IJ central line tip in the mid SVC.  Endotracheal tube 8.5 cm above the carina.  Cardiomegaly noted with slight vascular and interstitial prominence.  Minimal basilar atelectasis. No large effusion or pneumothorax.  IMPRESSION: Right IJ central line tip mid SVC.  No pneumothorax  Cardiomegaly without CHF  Basilar atelectasis  Original Report Authenticated By: Judie Petit. Ruel Favors, M.D.   Dg Abd Portable 1v  05/17/2012  *RADIOLOGY REPORT*  Clinical Data: Nasogastric tube placement.  PORTABLE ABDOMEN - 1 VIEW 05/17/2012 1537 hours:  Comparison: None.  Findings: No gas tube tip in the fundus or proximal body of the stomach.  Linear opacity projected over the left upper quadrant the abdomen may be artifactual and outside the patient.  Visualized bowel gas pattern unremarkable.  IMPRESSION: Nasogastric tube tip in the fundus or proximal body of the stomach. No acute abdominal abnormality.  Original Report Authenticated By: Arnell Sieving, M.D.    Lab 05/18/12 0500 05/17/12 0640  05/16/12 2035  NA 141 138 140  K 4.2 4.5 4.4  CL 105 104 106  CO2 25 20 22   BUN 19 20 22   CREATININE 1.41* 1.56* 1.55*  GLUCOSE 210* 190* 168*    Lab 05/18/12 0500 05/16/12 2035  HGB 13.9 15.9  HCT 42.3 46.1  WBC 12.7* 11.9*  PLT 203 153    ABG    Component Value Date/Time   PHART 7.405 05/16/2012 1932   PCO2ART 32.1* 05/16/2012 1932   PO2ART 107.0* 05/16/2012 1932   HCO3 19.7* 05/16/2012 1932   TCO2 20.7 05/16/2012 1932   ACIDBASEDEF 4.1* 05/16/2012 1932   O2SAT 97.6 05/16/2012 1932    ASSESSMENT AND PLAN VDRF secondary to Embolic Stroke, Rt hemiplegia, AMS -Vent as ordered -attempt to wean but very low threshold for tracheostomy. Failed self extubation at [facility]. -Will allow the next two days to evaluate for weaning, if unsuccessful (which is very unlikely) will trach on Friday.   -Dr. Zenda Alpers spoke with family, ready for [place] to trach at anytime.  Embolic stroke with sub therapeutic INR -Neuro exam is very poor.  Would be appropriate to call neuro for prognostication.   UTI with resolved sepsis -Agree with abx use, now patient is off pressors.  Cardiomyopathy -Echo pending.  Would hold off diureses for now since patient's BP is questionable.  Brett Canales Minor ACNP [facility] PCCM Pager [phone] till 3 pm If no answer page [phone]  05/18/2012, 9:48 AM  Agree with above note.  Patient's mental status is very poor and he will not be able to protect his airway.  At this point will plan on trach tomorrow at 1 PM and will begin aggressive weaning after that.  At this point however, keep as even as possible, hold TF and anticoag after midnight, place trach kit bedside and will trach tomorrow.  CC time 40 min.  Patient seen and examined, agree with above note.  I dictated the care and orders written for this patient under my direction.  Koren Bound, M.D. [phone]

## 2012-05-19 ENCOUNTER — Other Ambulatory Visit (HOSPITAL_COMMUNITY): Payer: Medicare Other

## 2012-05-19 ENCOUNTER — Encounter (HOSPITAL_COMMUNITY): Payer: Medicare Other

## 2012-05-19 DIAGNOSIS — R0602 Shortness of breath: Secondary | ICD-10-CM

## 2012-05-19 MED ORDER — FENTANYL CITRATE 0.05 MG/ML IJ SOLN: 25.0000 ug | INTRAMUSCULAR | Status: AC | PRN

## 2012-05-19 NOTE — Procedures (Signed)
Tracheostomy Placement  Consent from family.  Patient sedated and paralyzed.  Area cleaned, lidocaine injected, skin incision placed followed by blunt dissection.  Airway entered, catheter advanced, wire placed then airway crushed, dilated and tracheostomy placed.  Note that patient's tracheal rings were so calcified that dilation and placement of tracheostomy.  Had to place a size 6 tracheostomy.  CXR ordered and pending.  Alyson Reedy, M.D. [facility] Pulmonary/Critical Care Medicine. Pager: [phone]. After hours pager: [phone].

## 2012-05-19 NOTE — Procedures (Signed)
Bedside Tracheostomy Insertion Procedure Note   Patient Details:   Name: Isaac Leonard DOB: 05/11/1946 MRN: [id number]  Procedure: Tracheostomy  Pre Procedure Assessment: ET Tube Size:8.5 ET Tube secured at lip (cm):21 Bite block in place: Yes Breath Sounds: Clear  Post Procedure Assessment:  O2 sats: stable throughout Complications: No apparent complications Patient did tolerate procedure well Tracheostomy Brand:Shiley Tracheostomy Style:Cuffed Tracheostomy Size: 6 Tracheostomy Secured NWG:[id number] Tracheostomy Placement Confirmation:Trach cuff visualized and in place and Chest X ray ordered for placement    Leonard Downing 05/19/2012, 2:25 PM

## 2012-05-19 NOTE — Procedures (Signed)
Bronchoscopy  for Percutaneous  Tracheostomy  Name: Isaac Leonard MRN: [id number] DOB: 1946/11/12 Procedure: Bronchoscopy for Percutaneous Tracheostomy Indications: Diagnostic evaluation of the airways In conjunction with: Dr. Molli Knock  Procedure Details Consent: Risks of procedure as well as the alternatives and risks of each were explained to the (patient/caregiver).  Consent for procedure obtained. Time Out: Verified patient identification, verified procedure, site/side was marked, verified correct patient position, special equipment/implants available, medications/allergies/relevent history reviewed, required imaging and test results available.  Performed  In preparation for procedure, bronchoscope lubricated. Sedation: Benzodiazepines, Muscle relaxants and Etomidateand fentanyl.  Airway entered and the following bronchi were examined: Bronchi.   Procedures performed: Endotracheal Tube retracted in 2 cm increments. Cannulation of airway observed. Dilation observed. Placement of trachel tube  observed . No overt complications. Bronchoscope removed.    Evaluation Hemodynamic Status: BP stable throughout; O2 sats: stable throughout Patient's Current Condition: stable Specimens:  None Complications: No apparent complications Patient did tolerate procedure well.   Brett Canales Minor ACNP [facility] PCCM Pager [phone] till 3 pm If no answer page [phone] 05/19/2012, 1:54 PM   I was present for and supervised the entire procedure  Billy Fischer, MD ; [facility] service Mobile [phone].  After 5:30 PM or weekends, call [phone]

## 2012-05-19 NOTE — Progress Notes (Signed)
Name: Isaac Leonard MRN: [id number] DOB: 01/27/1946    LOS: 3  Referring Provider:  [facility] Reason for Referral: [facility]  PULMONARY / CRITICAL CARE MEDICINE  HPI:  66 yo wm who was found in parking lot 05/06/12 unresponsive and transported to [facility]. He was notable for Rt hemiplegia, Lt Thalamic infarct and required intubation to protect airway. He self extubated but could not protect his airway. He has known Artrial fibrillation on coumadin but INR was sub therapeutic on admit to [facility] and stroke was presumed embolic. He became septic with enterococcus UTI and required pressors and abx. Transferred to [facility] 6/24 on full ventilatory support via oral ET. PCCM asked to evaluate and manage ventilator.     Brief patient description:   Remains vent dependent.    Current Status: Remains vent dependent.  Vital Signs:  Vital signs reviewed. Abnormal values will appear under impression plan section.    Physical Examination: General:  Obese male Neuro:  No follows commands. Rt hemiplegia. Lt side strong HEENT:  PERL, no track or follow Neck:no JVD Cardiovascular:  hsd Lungs:  Coarse rhonchi Abdomen:  Obese + bs Musculoskeletal:  intact Skin:  Warm + lower ext edema  Vent: AC/VC 12/+22 Fio2 28% Vt 550 Peep 5 Dg Chest Port 1 View  05/18/2012  *RADIOLOGY REPORT*  Clinical Data: Intubation.  PORTABLE CHEST - 1 VIEW  Comparison: 05/17/2012.  Findings: Endotracheal tube is in satisfactory position. Nasogastric tube is followed into the stomach.  Right IJ central line tip projects over the SVC.  Heart size stable.  Lungs are somewhat low in volume with mild bibasilar atelectasis.  No pleural fluid.  IMPRESSION: Mild bibasilar atelectasis.  Original Report Authenticated By: Reyes Ivan, M.D.   Dg Chest Portable 1 View  05/17/2012  *RADIOLOGY REPORT*  Clinical Data: Advancement of endotracheal tube and addition of OG tube.  PORTABLE CHEST - 1 VIEW  Comparison: 05/17/2012; 05/16/2012  Findings:  Grossly  unchanged borderline enlarged cardiac silhouette and mediastinal contours.  Endotracheal tube tip again overlies the tracheal air column, approximately 9 cm above the carina.  Interval placement of enteric tube with tip terminating inferior to the left hemidiaphragm.  Unchanged positioning of right jugular approach central venous catheter with tip overlying the distal SVC. Pulmonary vasculature remains indistinct with cephalization of flow.  There is persistent mild elevation of the right hemidiaphragm.  Grossly unchanged basilar opacities.  No definite pleural effusion or pneumothorax.  Unchanged bones.  IMPRESSION: 1.  Grossly unchanged positioning of endotracheal tube with tip approximately 9 cm above the carina.  No pneumothorax. 2.  Interval placement of enteric tube, terminating inferior to the left hemidiaphragm. 3.  Suspect mild pulmonary edema.  Original Report Authenticated By: Waynard Reeds, M.D.   Dg Abd Portable 1v  05/17/2012  *RADIOLOGY REPORT*  Clinical Data: Nasogastric tube placement.  PORTABLE ABDOMEN - 1 VIEW 05/17/2012 1537 hours:  Comparison: None.  Findings: No gas tube tip in the fundus or proximal body of the stomach.  Linear opacity projected over the left upper quadrant the abdomen may be artifactual and outside the patient.  Visualized bowel gas pattern unremarkable.  IMPRESSION: Nasogastric tube tip in the fundus or proximal body of the stomach. No acute abdominal abnormality.  Original Report Authenticated By: Arnell Sieving, M.D.    Lab 05/18/12 0500 05/17/12 0640 05/16/12 2035  NA 141 138 140  K 4.2 4.5 4.4  CL 105 104 106  CO2 25 20 22   BUN 19 20  22  CREATININE 1.41* 1.56* 1.55*  GLUCOSE 210* 190* 168*    Lab 05/18/12 0500 05/16/12 2035  HGB 13.9 15.9  HCT 42.3 46.1  WBC 12.7* 11.9*  PLT 203 153    ABG    Component Value Date/Time   PHART 7.405 05/16/2012 1932   PCO2ART 32.1* 05/16/2012 1932   PO2ART 107.0* 05/16/2012 1932   HCO3 19.7* 05/16/2012 1932     TCO2 20.7 05/16/2012 1932   ACIDBASEDEF 4.1* 05/16/2012 1932   O2SAT 97.6 05/16/2012 1932    ASSESSMENT AND PLAN VDRF secondary to Embolic Stroke, Rt hemiplegia, AMS -Vent as ordered -attempt to wean but very low threshold for tracheostomy. Failed self extubation at [facility]. -Will allow the next two days to evaluate for weaning, if unsuccessful (which is very unlikely) will trach on Friday.   -Dr. Zenda Alpers spoke with family, ready for [place] to trach at anytime. -Plan trach 6/27 at 1300  Embolic stroke with sub therapeutic INR -Neuro exam is very poor.  Would be appropriate to call neuro for prognostication.   UTI with resolved sepsis -Agree with abx use, now patient is off pressors.  Cardiomyopathy -Echo pending.  Would hold off diureses for now since patient's BP is questionable.  Brett Canales Minor ACNP [facility] PCCM Pager [phone] till 3 pm If no answer page [phone] 05/19/2012, 9:00 AM  Will trach today and proceed aggressively with weaning after that.  Maintain dry.  Advance with weaning in AM.  CC time 35 min.  Patient seen and examined, agree with above note.  I dictated the care and orders written for this patient under my direction.  Koren Bound, M.D. [phone]

## 2012-05-20 ENCOUNTER — Other Ambulatory Visit (HOSPITAL_COMMUNITY): Payer: Medicare Other

## 2012-05-20 LAB — CULTURE, BLOOD (SINGLE)

## 2012-05-20 LAB — PROCALCITONIN: Procalcitonin: 0.33 ng/mL

## 2012-05-20 LAB — PROTIME-INR: INR: 1.4 (ref 0.00–1.49)

## 2012-05-20 NOTE — Progress Notes (Signed)
Name: Isaac Leonard MRN: [id number] DOB: 07-12-1946    LOS: 4  Referring Provider:  [facility] Reason for Referral: [facility]  PULMONARY / CRITICAL CARE MEDICINE  HPI:  66 yo wm who was found in parking lot 05/06/12 unresponsive and transported to [facility]. He was notable for Rt hemiplegia, Lt Thalamic infarct and required intubation to protect airway. He self extubated but could not protect his airway. He has known Artrial fibrillation on coumadin but INR was sub therapeutic on admit to [facility] and stroke was presumed embolic. He became septic with enterococcus UTI and required pressors and abx. Transferred to [facility] 6/24 on full ventilatory support via oral ET. PCCM asked to evaluate and manage ventilator.     Brief patient description:   Remains vent dependent.    Current Status: Remains vent dependent.  Vital Signs:  Vital signs reviewed. Abnormal values will appear under impression plan section.    Physical Examination: General:  Obese male, tarch sight clean. Neuro:  No follows commands. Rt hemiplegia. Lt side strong HEENT:  PERL, no track or follow Neck:no JVD Cardiovascular:  hsd Lungs:  Coarse rhonchi Abdomen:  Obese + bs Musculoskeletal:  intact Skin:  Warm + lower ext edema  Vent: AC/VC 12/+22 Fio2 28% Vt 550 Peep 5 Dg Chest Port 1 View  05/20/2012  *RADIOLOGY REPORT*  Clinical Data: Tracheostomy.  Ventilator.  PORTABLE CHEST - 1 VIEW  Comparison: 05/19/2012  Findings: Support devices are unchanged.  Minimal right base atelectasis or scarring.  Blunting of the right costophrenic angle is stable, possibly small effusion or pleural thickening.  Left lung is clear.  Heart is enlarged.  No acute bony abnormality.  IMPRESSION: No significant change.  Original Report Authenticated By: Cyndie Chime, M.D.   Dg Chest Portable 1 View  05/19/2012  *RADIOLOGY REPORT*  Clinical Data: Tracheostomy placement.  PORTABLE CHEST - 1 VIEW  Comparison: 05/19/2012  Findings: Tracheostomy catheter projects  over the mid trachea.  No pneumothorax.  Right central line tip is in the SVC, unchanged. Heart is borderline in size.  Lungs are clear.  No acute bony abnormality.  IMPRESSION: Tracheostomy placement.  No pneumothorax.  No active cardiopulmonary disease.  Original Report Authenticated By: Cyndie Chime, M.D.   Dg Chest Port 1 View  05/19/2012  *RADIOLOGY REPORT*  Clinical Data: Evaluate endotracheal tube placement.  PORTABLE CHEST - 1 VIEW  Comparison: Chest x-ray 05/18/2012.  Findings: An endotracheal tube is in place with tip 7.8 cm above the carina. A nasogastric tube is seen extending into the stomach, however, the tip of the nasogastric tube extends below the lower margin of the image. There is a right-sided internal jugular central venous catheter with tip terminating in the mid superior vena cava.Lung volumes are low.  There are bibasilar opacities favored to represent areas of subsegmental atelectasis.  Pulmonary vascular crowding accentuated by low lung volumes, without frank pulmonary edema.  Heart size is mild - moderately enlarged (unchanged).  No definite pleural effusions.  Mediastinal contours are within normal limits given the patient positioning.  IMPRESSION: 1.  Support apparatus, as above. 2.  Low lung volumes with probable bibasilar subsegmental atelectasis. 3.  Mild- moderate cardiomegaly again noted with mild pulmonary venous congestion, but without frank pulmonary edema.  Original Report Authenticated By: Florencia Reasons, M.D.   Dg Abd Portable 1v  05/19/2012  *RADIOLOGY REPORT*  Clinical Data: Nasogastric tube placed  PORTABLE ABDOMEN - 1 VIEW  Comparison: 05/17/2012  Findings: Nasogastric tube tip lies in the  proximal stomach near the fundus.  The tube could be advanced further to lie along the greater curvature.  Gas pattern is nonspecific.  IMPRESSION: Nasogastric tube lies in the proximal stomach.  Original Report Authenticated By: Elsie Stain, M.D.    Lab 05/18/12 0500  05/17/12 0640 05/16/12 2035  NA 141 138 140  K 4.2 4.5 4.4  CL 105 104 106  CO2 25 20 22   BUN 19 20 22   CREATININE 1.41* 1.56* 1.55*  GLUCOSE 210* 190* 168*    Lab 05/18/12 0500 05/16/12 2035  HGB 13.9 15.9  HCT 42.3 46.1  WBC 12.7* 11.9*  PLT 203 153    ABG    Component Value Date/Time   PHART 7.405 05/16/2012 1932   PCO2ART 32.1* 05/16/2012 1932   PO2ART 107.0* 05/16/2012 1932   HCO3 19.7* 05/16/2012 1932   TCO2 20.7 05/16/2012 1932   ACIDBASEDEF 4.1* 05/16/2012 1932   O2SAT 97.6 05/16/2012 1932    ASSESSMENT AND PLAN VDRF secondary to Embolic Stroke, Rt hemiplegia, AMS -Begin weaning now that patient is trached. -Weaning per protocol.  Embolic stroke with sub therapeutic INR -Neuro exam is very poor.  Would be appropriate to call neuro for prognostication if family wishes so, will defer to primary.  UTI with resolved sepsis -Agree with abx use, now patient is off pressors.  Cardiomyopathy -Echo pending.  Would hold off diureses for now since patient's BP is questionable.  Alyson Reedy, M.D. [facility] Pulmonary/Critical Care Medicine. Pager: [phone]. After hours pager: [phone].

## 2012-05-21 LAB — CBC WITH DIFFERENTIAL/PLATELET
Basophils Absolute: 0 10*3/uL (ref 0.0–0.1)
Lymphs Abs: 2.4 10*3/uL (ref 0.7–4.0)
MCH: 28.7 pg (ref 26.0–34.0)
MCHC: 33.3 g/dL (ref 30.0–36.0)
MCV: 86.2 fL (ref 78.0–100.0)
Monocytes Absolute: 1 10*3/uL (ref 0.1–1.0)
Platelets: 297 10*3/uL (ref 150–400)
RDW: 14.5 % (ref 11.5–15.5)

## 2012-05-21 LAB — BASIC METABOLIC PANEL
BUN: 19 mg/dL (ref 6–23)
Chloride: 104 mEq/L (ref 96–112)
Creatinine, Ser: 1.19 mg/dL (ref 0.50–1.35)
GFR calc Af Amer: 72 mL/min — ABNORMAL LOW (ref >90)

## 2012-05-21 LAB — PROTIME-INR: Prothrombin Time: 17.1 seconds — ABNORMAL HIGH (ref 11.6–15.2)

## 2012-05-22 LAB — PROTIME-INR: INR: 1.16 (ref 0.00–1.49)

## 2012-05-23 LAB — CULTURE, BLOOD (ROUTINE X 2): Culture: NO GROWTH

## 2012-05-23 LAB — CLOSTRIDIUM DIFFICILE BY PCR: Toxigenic C. Difficile by PCR: NEGATIVE

## 2012-05-23 NOTE — Progress Notes (Signed)
Name: Isaac Leonard MRN: [id number] DOB: [birth year]    LOS: 7  Referring Provider:  [facility] Reason for Referral: [facility]  PULMONARY / CRITICAL CARE MEDICINE  HPI:  66 yo wm who was found in parking lot 05/06/12 unresponsive and transported to [facility]. He was notable for Rt hemiplegia, Lt Thalamic infarct and required intubation to protect airway. He self extubated but could not protect his airway. He has known Artrial fibrillation on coumadin but INR was sub therapeutic on admit to [facility] and stroke was presumed embolic. He became septic with enterococcus UTI and required pressors and abx. Transferred to [facility] 6/24 on full ventilatory support via oral ET. PCCM asked to evaluate and manage ventilator.      EVENTS  Remains vent dependent.    Current Status:   7/1 - Now s/p trach. On PSV 4h. RASS -3, gag +. Unrespnsive despite lack of sedation other thaan seroquel, zoloft scheduled and oxycodone prn, and morphine pr   Vital Signs:  Vital signs reviewed.  Temp 99.3 P 60 RR 18 BP 114/75 Pulse ox 99%  Physical Examination: General:  Obese male, tarch sight clean. Neuro:  No follows commands. Rt hemiplegia. Lt side strong. RASS - 3 HEENT:  PERL, no track or follow Neck:no JVD Cardiovascular:  hsd Lungs:  Coarse, no wheeze Abdomen:  Obese + bs Musculoskeletal:  intact Skin:  Warm + lower ext edema  Vent: AC/VC 12/+22 Fio2 28% Vt 550 Peep 5 No results found.  Lab 05/21/12 0527 05/18/12 0500 05/17/12 0640  NA 140 141 138  K 3.9 4.2 4.5  CL 104 105 104  CO2 25 25 20   BUN 19 19 20   CREATININE 1.19 1.41* 1.56*  GLUCOSE 152* 210* 190*    Lab 05/21/12 0527 05/18/12 0500 05/16/12 2035  HGB 13.5 13.9 15.9  HCT 40.6 42.3 46.1  WBC 9.8 12.7* 11.9*  PLT 297 203 153    ABG    Component Value Date/Time   PHART 7.405 05/16/2012 1932   PCO2ART 32.1* 05/16/2012 1932   PO2ART 107.0* 05/16/2012 1932   HCO3 19.7* 05/16/2012 1932   TCO2 20.7 05/16/2012 1932   ACIDBASEDEF 4.1* 05/16/2012 1932   O2SAT 97.6 05/16/2012 1932    ASSESSMENT AND PLAN VDRF secondary to Embolic Stroke, Rt hemiplegia, AMS  - on 7/1 he is s/p trach. Still unresponsive. Doing 4h pSV PLAN   - sBT per protocol as tolerated  - will not make headway wihtoug coming out of coma  Embolic stroke with sub therapeutic INR -Neuro exam is very poor.  Would be appropriate to call neuro for prognostication if family wishes so, will defer to primary.   Cardiomyopathy -Echo pending as of 05/23/12  Would hold off diureses for now but wil check bnp 05/24/12     Dr. Kalman Shan, M.D., [facility].C.P Pulmonary and Critical Care Medicine Staff Physician [facility] [place] Pulmonary and Critical Care Pager: [phone], If no answer or between  15:00h - 7:00h: call [phone]  05/23/2012 11:31 AM

## 2012-05-24 ENCOUNTER — Encounter: Payer: Self-pay | Admitting: Radiology

## 2012-05-24 LAB — BASIC METABOLIC PANEL
CO2: 28 mEq/L (ref 19–32)
Calcium: 9.1 mg/dL (ref 8.4–10.5)
GFR calc Af Amer: 71 mL/min — ABNORMAL LOW (ref >90)
GFR calc non Af Amer: 61 mL/min — ABNORMAL LOW (ref >90)
Sodium: 138 mEq/L (ref 135–145)

## 2012-05-24 LAB — CBC
MCH: 28.7 pg (ref 26.0–34.0)
MCHC: 33.4 g/dL (ref 30.0–36.0)
Platelets: 331 10*3/uL (ref 150–400)
RBC: 4.98 MIL/uL (ref 4.22–5.81)

## 2012-05-24 LAB — PROTIME-INR: Prothrombin Time: 15.1 seconds (ref 11.6–15.2)

## 2012-05-24 LAB — PRO B NATRIURETIC PEPTIDE: Pro B Natriuretic peptide (BNP): 1079 pg/mL — ABNORMAL HIGH (ref 0–125)

## 2012-05-24 NOTE — H&P (Addendum)
Isaac Leonard is an 66 y.o. male.   Chief Complaint: found unresponsive in parking lot next to car 05/06/12. Known Afib- on coumadin; CVA AMS; on vent; full code HPI: afib - off coumadin now; CKD; dysphagia Scheduled for percutaneous gastric tube in IR 7/3  Past Medical History  Diagnosis Date  . Atrial fibrillation     1st noted in 9/11. Now on coumadin. Cardioverted to NSR (08/28/10)  . Cardiomyopathy     with systolic CHF: uincertain etiolgogy. Could be tachycardia-mediated. Pt was admitted in 9/11 with CHF exacerbation Echo (9/11) EF < 25% severe global hypokinesis, RV severely dilated with moderate to severely decreased systolic function, mild MR (read by outside cardiologist). TEe (10/11): EF 35-40%, no LAA thrombus, RV normal size with mildly decreased systolic function, mild MR  . CKD (chronic kidney disease)     No past surgical history on file.  Family History  Problem Relation Age of Onset  . Heart failure Mother   . Prostate cancer Father    Social History:  has an unknown smoking status. He does not have any smokeless tobacco history on file. He reports that he does not drink alcohol. His drug history not on file.  Allergies: Not on File  Medications Prior to Admission  Medication Sig Dispense Refill  . aspirin (ASPIR-LOW) 81 MG EC tablet Take 81 mg by mouth daily.        . carvedilol (COREG) 12.5 MG tablet TAKE ONE TABLET BY MOUTH TWICE A DAY  60 tablet  6  . enalapril (VASOTEC) 5 MG tablet TAKE ONE TABLET BY MOUTH TWICE A DAY  60 tablet  2  . furosemide (LASIX) 20 MG tablet Take 20 mg by mouth. Take 1-2 tabs by mouth daily as needed for 2-3 lb weight gain       . isosorbide mononitrate (IMDUR) 30 MG 24 hr tablet Take 30 mg by mouth daily.        . Simethicone (GAS-X PO) Take by mouth.        . warfarin (COUMADIN) 5 MG tablet Take 5 mg by mouth daily. Use as directed by Anticoagulation clinic         Results for orders placed during the hospital encounter of 05/16/12  (from the past 48 hour(s))  CLOSTRIDIUM DIFFICILE BY PCR     Status: Normal   Collection Time   05/23/12  1:06 AM      Component Value Range Comment   C difficile by pcr NEGATIVE  NEGATIVE   PROTIME-INR     Status: Abnormal   Collection Time   05/23/12  4:50 AM      Component Value Range Comment   Prothrombin Time 15.6 (*) 11.6 - 15.2 seconds    INR 1.21  0.00 - 1.49   PROTIME-INR     Status: Normal   Collection Time   05/24/12  4:45 AM      Component Value Range Comment   Prothrombin Time 15.1  11.6 - 15.2 seconds    INR 1.17  0.00 - 1.49   CBC     Status: Abnormal   Collection Time   05/24/12  4:45 AM      Component Value Range Comment   WBC 12.6 (*) 4.0 - 10.5 K/uL    RBC 4.98  4.22 - 5.81 MIL/uL    Hemoglobin 14.3  13.0 - 17.0 g/dL    HCT 16.1  09.6 - 04.5 %    MCV 85.9  78.0 - 100.0  fL    MCH 28.7  26.0 - 34.0 pg    MCHC 33.4  30.0 - 36.0 g/dL    RDW 16.1  09.6 - 04.5 %    Platelets 331  150 - 400 K/uL   BASIC METABOLIC PANEL     Status: Abnormal   Collection Time   05/24/12  4:45 AM      Component Value Range Comment   Sodium 138  135 - 145 mEq/L    Potassium 3.9  3.5 - 5.1 mEq/L    Chloride 101  96 - 112 mEq/L    CO2 28  19 - 32 mEq/L    Glucose, Bld 140 (*) 70 - 99 mg/dL    BUN 21  6 - 23 mg/dL    Creatinine, Ser 4.09  0.50 - 1.35 mg/dL    Calcium 9.1  8.4 - 81.1 mg/dL    GFR calc non Af Amer 61 (*) >90 mL/min    GFR calc Af Amer 71 (*) >90 mL/min   PRO B NATRIURETIC PEPTIDE     Status: Abnormal   Collection Time   05/24/12  4:45 AM      Component Value Range Comment   Pro B Natriuretic peptide (BNP) 1079.0 (*) 0 - 125 pg/mL    No results found.  Review of Systems  Constitutional: Negative for fever.  Neurological: Positive for loss of consciousness.       CVA; AMS vent    There were no vitals taken for this visit. Physical Exam  Cardiovascular: Normal rate, regular rhythm and normal heart sounds.   No murmur heard. Respiratory: Effort normal and breath  sounds normal. He has no wheezes.       vent  GI: Soft. Bowel sounds are normal.  Musculoskeletal:       Able to move head and follow me in room  Neurological: A cranial nerve deficit is present. Coordination abnormal.       On vent; tries to communicate tho- follows me in room  Psychiatric:       Will need consent from family     Assessment/Plan CVA; some response now and trying to communicate Full Code staus Dysphagia Known afib - off coumadin now lovenox held Need consent from family  Naveh Rickles A 05/24/2012, 9:36 AM

## 2012-05-25 ENCOUNTER — Other Ambulatory Visit (HOSPITAL_COMMUNITY): Payer: Medicare Other

## 2012-05-25 LAB — CBC
Platelets: 405 10*3/uL — ABNORMAL HIGH (ref 150–400)
RBC: 4.74 MIL/uL (ref 4.22–5.81)
WBC: 13 10*3/uL — ABNORMAL HIGH (ref 4.0–10.5)

## 2012-05-25 LAB — PROTIME-INR
INR: 1.17 (ref 0.00–1.49)
Prothrombin Time: 15.1 seconds (ref 11.6–15.2)

## 2012-05-25 MED ORDER — MIDAZOLAM HCL 5 MG/5ML IJ SOLN
INTRAMUSCULAR | Status: AC | PRN
  Administered 2012-05-25: 1 mg via INTRAVENOUS

## 2012-05-25 MED ORDER — FENTANYL CITRATE 0.05 MG/ML IJ SOLN
INTRAMUSCULAR | Status: AC
  Filled 2012-05-25: qty 4

## 2012-05-25 MED ORDER — MIDAZOLAM HCL 2 MG/2ML IJ SOLN
INTRAMUSCULAR | Status: AC
  Filled 2012-05-25: qty 4

## 2012-05-25 MED ORDER — IOHEXOL 300 MG/ML  SOLN
50.0000 mL | Freq: Once | INTRAMUSCULAR | Status: AC | PRN
  Administered 2012-05-25: 10 mL via INTRAVENOUS

## 2012-05-25 MED ORDER — FENTANYL CITRATE 0.05 MG/ML IJ SOLN
INTRAMUSCULAR | Status: AC | PRN
  Administered 2012-05-25: 50 ug via INTRAVENOUS

## 2012-05-25 NOTE — Procedures (Signed)
Successful fluoroscopic guided insertion of gastrostomy tube without immediate post procedural complicatoin.   The gastrostomy tube may be used immediately for medications.  Otherwise, place gastrostomy tube to low wall suction for 24 hrs.  Tube feeds may be initiated in 24 hours as per the primary team.   

## 2012-05-26 ENCOUNTER — Other Ambulatory Visit (HOSPITAL_COMMUNITY): Payer: Medicare Other

## 2012-05-26 LAB — BASIC METABOLIC PANEL
BUN: 20 mg/dL (ref 6–23)
CO2: 28 mEq/L (ref 19–32)
Chloride: 100 mEq/L (ref 96–112)
Creatinine, Ser: 1.37 mg/dL — ABNORMAL HIGH (ref 0.50–1.35)

## 2012-05-26 LAB — CBC
HCT: 41.1 % (ref 39.0–52.0)
Hemoglobin: 13.7 g/dL (ref 13.0–17.0)
MCHC: 33.3 g/dL (ref 30.0–36.0)
MCV: 86 fL (ref 78.0–100.0)
RDW: 14.3 % (ref 11.5–15.5)

## 2012-05-26 LAB — URINE CULTURE: Colony Count: NO GROWTH

## 2012-05-26 NOTE — Progress Notes (Signed)
Subjective: Pt resting quietly  Objective: Vital signs in last 24 hours: Pulse Rate:  [94-113] 100  (07/03 1216) Resp:  [10-20] 10  (07/03 1216) BP: (156-175)/(97-122) 164/103 mmHg (07/03 1216) SpO2:  [100 %] 100 % (07/03 1216)    Intake/Output from previous day:   Intake/Output this shift:    Gastrostomy tube intact, insertion site ok, abd soft, +BS,ND  Lab Results:   [facility] 05/26/12 0557 05/25/12 0440  WBC 10.5 13.0*  HGB 13.7 13.5  HCT 41.1 41.0  PLT 381 405*   BMET  Basename 05/26/12 0557 05/24/12 0445  NA 141 138  K 3.6 3.9  CL 100 101  CO2 28 28  GLUCOSE 106* 140*  BUN 20 21  CREATININE 1.37* 1.20  CALCIUM 8.7 9.1   PT/INR  Basename 05/26/12 0557 05/25/12 0440  LABPROT 15.4* 15.1  INR 1.19 1.17   ABG No results found for this basename: PHART:2,PCO2:2,PO2:2,HCO3:2 in the last 72 hours  Studies/Results: Ir Gastrostomy Tube Mod Sed  05/25/2012  *RADIOLOGY REPORT*  Indication:  Dysphasia  PULL TROUGH GASTOSTOMY TUBE PLACEMENT  Medications:  Versed 1 mg IV; Fentanyl 50 mcg IV; Antibiotics were administered within 1 hour of the procedure.  Contrast volume:  10 mL Omnipaque-300 administered into the gastric lumen  Sedation time: 12 minutes  Fluoroscopy time: 3.2 minutes  Complications: None immediate  PROCEDURE/FINDINGS:  Informed written consent was obtained from the patient's family following explanation of the procedure, risks, benefits and alternatives.  A time out was performed prior to the initiation of the procedure.  Maximal barrier sterile technique utilized including caps, mask, sterile gowns, sterile gloves, large sterile drape, hand hygiene and Betadine prep.  The left upper quadrant was sterilely prepped and draped.  An oral gastric catheter was inserted into the stomach under fluoroscopy. The existing nasogastric feeding tube was removed.  The left costal margin and barium opacified transverse colon were identified and avoided.  Air was injected into  the stomach for insufflation and visualization under fluoroscopy.  Under sterile conditions a 17 gauge trocar needle was utilized to access the stomach percutaneously beneath the left subcostal margin after the overlying soft tissues were anesthetized with 1% Lidocaine with epinephrine.  Needle position was confirmed within the stomach with aspiration of air and injection of small amount of contrast.   A single T tack was deployed for gastropexy.  Over an Amplatz guide wire, a 9-French sheath was inserted into the stomach.  A snare device was utilized to capture the oral gastric catheter.  The snare device was pulled retrograde from the stomach up the esophagus and out the oropharynx.  The 20-French pull-through gastrostomy was connected to the snare device and pulled antegrade through the oropharynx down the esophagus into the stomach and then through the percutaneous tract external to the patient.  The gastrostomy was assembled externally.  Contrast injection confirms position in the stomach.   Several spot radiographic images were obtained in various obliquities for documentation.  The patient tolerated procedure well without immediate post procedural complication.  IMPRESSION:  Successful fluoroscopic insertion of a 20-French "pull-through" gastrostomy.  Original Report Authenticated By: Waynard Reeds, M.D.   Dg Chest Port 1 View  05/26/2012  *RADIOLOGY REPORT*  Clinical Data: Hypoxia, CHF  PORTABLE CHEST - 1 VIEW  Comparison: Portable chest x-ray of 05/20/2012  Findings:  There is mild linear atelectasis at the left lung base. Cardiomegaly is stable.  Right central venous line and tracheostomy remain.  IMPRESSION: Mild left basilar  linear atelectasis.  Stable cardiomegaly with tracheostomy and central venous line.  Original Report Authenticated By: Juline Patch, M.D.   Dg Abd Portable 1v  05/25/2012  *RADIOLOGY REPORT*  Clinical Data: The tube placement.  PORTABLE ABDOMEN - 1 VIEW  Comparison: 05/19/2012   Findings: The lateral aspect of the right hemiabdomen is excluded from the image as is the lower pelvis.  The feeding tube tip projects over the proximal stomach.  Bowel gas pattern is nonobstructive.  No acute osseous finding.  IMPRESSION: Feeding tube tip projects over the proximal stomach. Advancement recommend.  Original Report Authenticated By: Waneta Martins, M.D.    Anti-infectives: Anti-infectives    None      Assessment/Plan: S/P perc gastrostomy tube 7/3; ok to use tube; other plans as per primary   LOS: 10 days    Jilliam Bellmore,D [facility] 05/26/2012

## 2012-05-27 DIAGNOSIS — I5022 Chronic systolic (congestive) heart failure: Secondary | ICD-10-CM

## 2012-05-27 LAB — BASIC METABOLIC PANEL
CO2: 24 mEq/L (ref 19–32)
Calcium: 8.6 mg/dL (ref 8.4–10.5)
Chloride: 103 mEq/L (ref 96–112)
Creatinine, Ser: 1.13 mg/dL (ref 0.50–1.35)
Glucose, Bld: 126 mg/dL — ABNORMAL HIGH (ref 70–99)
Sodium: 140 mEq/L (ref 135–145)

## 2012-05-27 LAB — URINE MICROSCOPIC-ADD ON

## 2012-05-27 LAB — URINALYSIS, ROUTINE W REFLEX MICROSCOPIC
Glucose, UA: NEGATIVE mg/dL
Hgb urine dipstick: NEGATIVE
Protein, ur: NEGATIVE mg/dL
pH: 5.5 (ref 5.0–8.0)

## 2012-05-27 MED FILL — Glucagon HCl (rDNA) For Inj 1 MG (Base Equiv): INTRAMUSCULAR | Qty: 1 | Status: AC

## 2012-05-27 NOTE — Progress Notes (Signed)
Name: Isaac Leonard MRN: [id number] DOB: November 28, 1945    LOS: 11  Referring Provider:  [facility] Reason for Referral: [facility]  PULMONARY / CRITICAL CARE MEDICINE  HPI:  66 yo wm who was found in parking lot 05/06/12 unresponsive and transported to [facility]. He was notable for Rt hemiplegia, Lt Thalamic infarct and required intubation to protect airway. He self extubated but could not protect his airway. He has known Artrial fibrillation on coumadin but INR was sub therapeutic on admit to [facility] and stroke was presumed embolic. He became septic with enterococcus UTI and required pressors and abx. Transferred to [facility] 6/24 on full ventilatory support via oral ET. PCCM asked to evaluate and manage ventilator.       EVENTS  7/1 - Now s/p trach. On PSV 4h. RASS -3, gag +. Unrespnsive despite lack of sedation other thaan seroquel, zoloft scheduled and oxycodone prn, and morphine  7/5 1st day on  ATC   Current Status: 7/5 improved mental status   Vital Signs: Vital signs reviewed.    Physical Examination: General:  Obese male, NAD Neuro:  No follows commands. Rt hemiplegia. Lt side strong. RASS - 3 HEENT:  PERL, no track or follow Neck:no JVD Cardiovascular:  s1s2 distant, no m/r/g Lungs:  Coarse, no wheeze Abdomen:  Obese + bs Musculoskeletal:  intact Skin:  Warm + lower ext edema  Vent: PSV 12/5 with 40%  LAB DATA  Lab 05/27/12 0705 05/26/12 0557 05/24/12 0445  NA 140 141 138  K 3.3* 3.6 3.9  CL 103 100 101  CO2 24 28 28   BUN 16 20 21   CREATININE 1.13 1.37* 1.20  GLUCOSE 126* 106* 140*    Lab 05/26/12 0557 05/25/12 0440 05/24/12 0445  HGB 13.7 13.5 14.3  HCT 41.1 41.0 42.8  WBC 10.5 13.0* 12.6*  PLT 381 405* 331    ABG    Component Value Date/Time   PHART 7.405 05/16/2012 1932   PCO2ART 32.1* 05/16/2012 1932   PO2ART 107.0* 05/16/2012 1932   HCO3 19.7* 05/16/2012 1932   TCO2 20.7 05/16/2012 1932   ACIDBASEDEF 4.1* 05/16/2012 1932   O2SAT 97.6 05/16/2012 1932    ASSESSMENT AND  PLAN VDR[facility] secondary to Embolic Stroke, Rt hemiplegia, AMS  PLAN - SBT per protocol as tolerated >> progressing well to ATC - 2h planned today - f/u cxr - diuresis per primary - abx per primary   Embolic stroke with sub therapeutic INR -PT / OT -mobilize as able   Cardiomyopathy Afib on Coumadin 7/1 ECHO>>>Left ventricle: Left ventricular diastolic function parameters were normal. Left atrium: The atrium was mildly dilated.OVerall LVF appears reduced with EF estimated at 40-45% withanterospetal hypokinesis.    BNP 1079 on 7/2 Plan: -diuresis per primary -coumadin per pharmacy   Canary Brim, NP-C [place] Pgr: [phone] or [phone]  Independently examined pt, evaluated data & formulated above care plan with NP  Cyril Mourning MD. [facility] [place] [facility] Pager [phone] If no response call [phone]

## 2012-05-28 LAB — PROTIME-INR
INR: 1.11 (ref 0.00–1.49)
Prothrombin Time: 14.5 seconds (ref 11.6–15.2)

## 2012-05-28 LAB — URINE CULTURE
Colony Count: NO GROWTH
Culture: NO GROWTH

## 2012-05-28 LAB — BASIC METABOLIC PANEL
Calcium: 8.7 mg/dL (ref 8.4–10.5)
GFR calc Af Amer: 63 mL/min — ABNORMAL LOW (ref >90)
GFR calc non Af Amer: 54 mL/min — ABNORMAL LOW (ref >90)
Glucose, Bld: 118 mg/dL — ABNORMAL HIGH (ref 70–99)
Potassium: 3.4 mEq/L — ABNORMAL LOW (ref 3.5–5.1)
Sodium: 143 mEq/L (ref 135–145)

## 2012-05-28 LAB — MAGNESIUM: Magnesium: 2.1 mg/dL (ref 1.5–2.5)

## 2012-05-29 ENCOUNTER — Other Ambulatory Visit (HOSPITAL_COMMUNITY): Payer: Medicare Other

## 2012-05-29 LAB — BASIC METABOLIC PANEL
Calcium: 9.2 mg/dL (ref 8.4–10.5)
GFR calc Af Amer: 51 mL/min — ABNORMAL LOW (ref >90)
GFR calc non Af Amer: 44 mL/min — ABNORMAL LOW (ref >90)
Potassium: 4.3 mEq/L (ref 3.5–5.1)
Sodium: 140 mEq/L (ref 135–145)

## 2012-05-29 LAB — PROTIME-INR: INR: 1.24 (ref 0.00–1.49)

## 2012-05-30 ENCOUNTER — Other Ambulatory Visit (HOSPITAL_COMMUNITY): Payer: Medicare Other

## 2012-05-30 LAB — PROTIME-INR
INR: 1.48 (ref 0.00–1.49)
Prothrombin Time: 18.2 seconds — ABNORMAL HIGH (ref 11.6–15.2)

## 2012-05-30 LAB — BASIC METABOLIC PANEL
BUN: 26 mg/dL — ABNORMAL HIGH (ref 6–23)
CO2: 24 mEq/L (ref 19–32)
Calcium: 9.2 mg/dL (ref 8.4–10.5)
Creatinine, Ser: 1.83 mg/dL — ABNORMAL HIGH (ref 0.50–1.35)
Glucose, Bld: 120 mg/dL — ABNORMAL HIGH (ref 70–99)

## 2012-05-30 NOTE — Progress Notes (Signed)
Name: Lauren Aguayo MRN: [id number] DOB: 1946-04-24    LOS: 14  Referring Provider:  [facility] Reason for Referral: [facility]  PULMONARY / CRITICAL CARE MEDICINE  HPI:  66 yo wm who was found in parking lot 05/06/12 unresponsive and transported to [facility]. He was notable for Rt hemiplegia, Lt Thalamic infarct and required intubation to protect airway. He self extubated but could not protect his airway. He has known Atrial fibrillation on coumadin but INR was sub therapeutic on admit to [facility] and stroke was presumed embolic. He became septic with enterococcus UTI and required pressors and abx. Transferred to [facility] 6/24 on full ventilatory support via oral ET. PCCM asked to evaluate and manage ventilator.       EVENTS  7/1 - Now s/p trach. On PSV 4h. RASS -3, gag +. Unrespnsive despite lack of sedation other thaan seroquel, zoloft scheduled and oxycodone prn, and morphine  7/5 1st day on  ATC   Current Status: Follows commands. No c/o.   Vital Signs: Vital signs reviewed. 98% on 35% ATC  Physical Examination: General:  Obese male, NAD Neuro:  No follows commands. Rt hemiplegia. Lt side strong. RASS - 3 HEENT:  PERL, no track or follow Neck:no JVD Cardiovascular:  s1s2 distant, no m/r/g Lungs:  Coarse rhonchi, no wheeze Abdomen:  Obese + bs Musculoskeletal:  intact Skin:  Warm + lower ext edema    LAB DATA  Lab 05/30/12 0715 05/29/12 0445 05/28/12 0500  NA 142 140 143  K 3.6 4.3 3.4*  CL 105 104 106  CO2 24 24 27   BUN 26* 23 18  CREATININE 1.83* 1.58* 1.33  GLUCOSE 120* 137* 118*    Lab 05/30/12 0715 05/26/12 0557 05/25/12 0440 05/24/12 0445  HGB 13.2 13.7 13.5 --  HCT 39.8 41.1 41.0 --  WBC -- 10.5 13.0* 12.6*  PLT -- 381 405* 331    ABG    Component Value Date/Time   PHART 7.405 05/16/2012 1932   PCO2ART 32.1* 05/16/2012 1932   PO2ART 107.0* 05/16/2012 1932   HCO3 19.7* 05/16/2012 1932   TCO2 20.7 05/16/2012 1932   ACIDBASEDEF 4.1* 05/16/2012 1932   O2SAT 97.6 05/16/2012 1932    PCXR: rotated film. Has basilar vol loss.   ASSESSMENT AND PLAN VDRF secondary to Embolic Stroke, Rt hemiplegia, AMS  PLAN -continue weaning protocol and work towards liberation from vent. Doubt that he can safely be decannulated but will see how he progresses.  - diuresis per primary - abx per primary   Embolic stroke with sub therapeutic INR -PT / OT -mobilize as able   Cardiomyopathy Afib on Coumadin 7/1 ECHO>>>Left ventricle: Left ventricular diastolic function parameters were normal. Left atrium: The atrium was mildly dilated.OVerall LVF appears reduced with EF estimated at 40-45% withanterospetal hypokinesis.   Plan: -diuresis per primary -coumadin per pharmacy  Up to 12 hour TC, progressing well.  Will continue to follow with you.  Patient seen and examined, agree with above note.  I dictated the care and orders written for this patient under my direction.  Koren Bound, M.D. [phone]

## 2012-05-31 LAB — BASIC METABOLIC PANEL
BUN: 22 mg/dL (ref 6–23)
CO2: 24 mEq/L (ref 19–32)
Chloride: 107 mEq/L (ref 96–112)
GFR calc Af Amer: 45 mL/min — ABNORMAL LOW (ref >90)
Glucose, Bld: 114 mg/dL — ABNORMAL HIGH (ref 70–99)
Potassium: 3.3 mEq/L — ABNORMAL LOW (ref 3.5–5.1)

## 2012-05-31 LAB — CBC
HCT: 37.6 % — ABNORMAL LOW (ref 39.0–52.0)
Hemoglobin: 11.9 g/dL — ABNORMAL LOW (ref 13.0–17.0)
MCHC: 31.6 g/dL (ref 30.0–36.0)

## 2012-05-31 LAB — PROTIME-INR: Prothrombin Time: 20.5 seconds — ABNORMAL HIGH (ref 11.6–15.2)

## 2012-06-01 LAB — BASIC METABOLIC PANEL
CO2: 23 mEq/L (ref 19–32)
Chloride: 107 mEq/L (ref 96–112)
Creatinine, Ser: 1.52 mg/dL — ABNORMAL HIGH (ref 0.50–1.35)

## 2012-06-01 LAB — PROTIME-INR: INR: 2.14 — ABNORMAL HIGH (ref 0.00–1.49)

## 2012-06-02 ENCOUNTER — Other Ambulatory Visit: Payer: Self-pay | Admitting: Cardiology

## 2012-06-02 LAB — PROTIME-INR
INR: 1.83 — ABNORMAL HIGH (ref 0.00–1.49)
Prothrombin Time: 21.5 s — ABNORMAL HIGH (ref 11.6–15.2)

## 2012-06-03 LAB — BASIC METABOLIC PANEL
Chloride: 105 mEq/L (ref 96–112)
Creatinine, Ser: 1.42 mg/dL — ABNORMAL HIGH (ref 0.50–1.35)
GFR calc Af Amer: 58 mL/min — ABNORMAL LOW (ref >90)
Potassium: 3.2 mEq/L — ABNORMAL LOW (ref 3.5–5.1)
Sodium: 143 mEq/L (ref 135–145)

## 2012-06-03 LAB — PROTIME-INR
INR: 1.54 — ABNORMAL HIGH (ref 0.00–1.49)
Prothrombin Time: 18.8 seconds — ABNORMAL HIGH (ref 11.6–15.2)

## 2012-06-03 NOTE — Progress Notes (Signed)
Name: Isaac Leonard MRN: [id number] DOB: 13-May-1946    LOS: 18  Referring Provider:  [facility] Reason for Referral: [facility]  PULMONARY / CRITICAL CARE MEDICINE  HPI:  66 yo wm who was found in parking lot 05/06/12 unresponsive and transported to [facility]. He was notable for Rt hemiplegia, Lt Thalamic infarct and required intubation to protect airway. He self extubated but could not protect his airway. He has known Atrial fibrillation on coumadin but INR was sub therapeutic on admit to [facility] and stroke was presumed embolic. He became septic with enterococcus UTI and required pressors and abx. Transferred to [facility] 6/24 on full ventilatory support via oral ET. PCCM asked to evaluate and manage ventilator.       EVENTS  7/1 - Now s/p trach. On PSV 4h. RASS -3, gag +. Unrespnsive despite lack of sedation other thaan seroquel, zoloft scheduled and oxycodone prn, and morphine  7/5 1st day on  ATC   Current Status: Follows commands. No c/o.   Vital Signs: Vital signs reviewed. 98% on 28% ATC  Physical Examination: General:  Obese male, NAD Neuro:  No follows commands. Rt hemiplegia. Lt side strong. RASS - 3 HEENT:  PERL, no track or follow Neck:no JVD Cardiovascular:  s1s2 distant, no m/r/g Lungs:  occ rhonchi, no wheeze Abdomen:  Obese + bs Musculoskeletal:  intact Skin:  Warm + lower ext edema    LAB DATA  Lab 06/03/12 0512 06/01/12 0500 05/31/12 0700  NA 143 142 143  K 3.2* 3.5 3.3*  CL 105 107 107  CO2 26 23 24   BUN 19 20 22   CREATININE 1.42* 1.52* 1.75*  GLUCOSE 138* 132* 114*    Lab 05/31/12 0700 05/30/12 0715  HGB 11.9* 13.2  HCT 37.6* 39.8  WBC 9.8 --  PLT 398 --    ABG    Component Value Date/Time   PHART 7.405 05/16/2012 1932   PCO2ART 32.1* 05/16/2012 1932   PO2ART 107.0* 05/16/2012 1932   HCO3 19.7* 05/16/2012 1932   TCO2 20.7 05/16/2012 1932   ACIDBASEDEF 4.1* 05/16/2012 1932   O2SAT 97.6 05/16/2012 1932   PCXR: rotated film. Has basilar vol loss.   ASSESSMENT AND  PLAN VDRF secondary to Embolic Stroke, Rt hemiplegia, AMS  PLAN -routine trach care and weaning protocol -Doubt that he can safely be decannulated but will see how he progresses.  - diuresis per primary - abx per primary   Embolic stroke with sub therapeutic INR -PT / OT -mobilize as able   Cardiomyopathy Afib on Coumadin 7/1 ECHO>>>Left ventricle: Left ventricular diastolic function parameters were normal. Left atrium: The atrium was mildly dilated.OVerall LVF appears reduced with EF estimated at 40-45% withanterospetal hypokinesis.   Plan: -diuresis per primary -coumadin per pharmacy  Renal insufficiency  P: Per IM service.   BABCOCK,PETE,   Approaching 48 hours off vent and tolerating it well.  Doubt decannulation in this clinical picture unless he has significant improvement.  Patient seen and examined, agree with above note.  I dictated the care and orders written for this patient under my direction.  Koren Bound, M.D. [phone]

## 2012-06-04 LAB — PROTIME-INR
INR: 1.58 — ABNORMAL HIGH (ref 0.00–1.49)
Prothrombin Time: 19.2 seconds — ABNORMAL HIGH (ref 11.6–15.2)

## 2012-06-05 LAB — PROTIME-INR
INR: 1.63 — ABNORMAL HIGH (ref 0.00–1.49)
Prothrombin Time: 19.6 seconds — ABNORMAL HIGH (ref 11.6–15.2)

## 2012-06-05 LAB — BASIC METABOLIC PANEL
CO2: 25 mEq/L (ref 19–32)
Calcium: 8.7 mg/dL (ref 8.4–10.5)
Creatinine, Ser: 1.44 mg/dL — ABNORMAL HIGH (ref 0.50–1.35)

## 2012-06-05 LAB — MAGNESIUM: Magnesium: 1.9 mg/dL (ref 1.5–2.5)

## 2012-06-06 LAB — BASIC METABOLIC PANEL
BUN: 21 mg/dL (ref 6–23)
GFR calc non Af Amer: 51 mL/min — ABNORMAL LOW (ref >90)
Glucose, Bld: 134 mg/dL — ABNORMAL HIGH (ref 70–99)
Potassium: 3.4 mEq/L — ABNORMAL LOW (ref 3.5–5.1)

## 2012-06-06 LAB — PROTIME-INR: INR: 1.52 — ABNORMAL HIGH (ref 0.00–1.49)

## 2012-06-06 NOTE — Progress Notes (Signed)
Name: Isaac Leonard MRN: [id number] DOB: 03-03-1946    LOS: 21  Referring Provider:  [facility] Reason for Referral: [facility]  PULMONARY / CRITICAL CARE MEDICINE  HPI:  66 yo wm who was found in parking lot 05/06/12 unresponsive and transported to [facility]. He was notable for Rt hemiplegia, Lt Thalamic infarct and required intubation to protect airway. He self extubated but could not protect his airway. He has known Atrial fibrillation on coumadin but INR was sub therapeutic on admit to [facility] and stroke was presumed embolic. He became septic with enterococcus UTI and required pressors and abx. Transferred to [facility] 6/24 on full ventilatory support via oral ET. PCCM asked to evaluate and manage ventilator.       EVENTS  7/1 - Now s/p trach. On PSV 4h. RASS -3, gag +. [name] despite lack of sedation other thaan seroquel, zoloft scheduled and oxycodone prn, and morphine  7/5 1st day on  ATC 7/15 48 hours on t- collar   Current Status: Follows commands. No c/o.   Vital Signs: Vital signs reviewed.  Physical Examination: General:  Obese male, NAD Neuro:  No follows commands. Rt hemiplegia ,dense. Lt side strong. RASS - 3 HEENT:  PERL, no track or follow Neck:no JVD Cardiovascular:  s1s2 distant, no m/r/g Lungs:  occ rhonchi, no wheeze Abdomen:  Obese + bs Musculoskeletal:  intact Skin:  Warm + lower ext edema    LAB DATA  Lab 06/06/12 0534 06/05/12 0500 06/03/12 0512  NA 145 144 143  K 3.4* 3.3* 3.2*  CL 106 106 105  CO2 28 25 26   BUN 21 21 19   CREATININE 1.39* 1.44* 1.42*  GLUCOSE 134* 133* 138*    Lab 05/31/12 0700  HGB 11.9*  HCT 37.6*  WBC 9.8  PLT 398    ABG    Component Value Date/Time   PHART 7.405 05/16/2012 1932   PCO2ART 32.1* 05/16/2012 1932   PO2ART 107.0* 05/16/2012 1932   HCO3 19.7* 05/16/2012 1932   TCO2 20.7 05/16/2012 1932   ACIDBASEDEF 4.1* 05/16/2012 1932   O2SAT 97.6 05/16/2012 1932   No results found.   ASSESSMENT AND PLAN VDRF secondary to Embolic  Stroke, Rt hemiplegia, AMS  PLAN -routine trach care and weaning protocol -Doubt that he can safely be decannulated but will see how he progresses.  - diuresis per primary - abx per primary -pccm see on mondays only   Embolic stroke with sub therapeutic INR -PT / OT -mobilize as able   Cardiomyopathy Afib on Coumadin 7/1 ECHO>>>Left ventricle: Left ventricular diastolic function parameters were normal. Left atrium: The atrium was mildly dilated.OVerall LVF appears reduced with EF estimated at 40-45% withanterospetal hypokinesis.   Plan: -diuresis per primary -coumadin per pharmacy  Renal insufficiency  P: Per IM service.  Brett Canales Minor ACNP [facility] PCCM Pager [phone] till 3 pm If no answer page [phone] 06/06/2012, 9:34 AM   Independently examined pt, evaluated data & formulated above care plan with NP  [facility].

## 2012-06-07 ENCOUNTER — Institutional Professional Consult (permissible substitution) (HOSPITAL_COMMUNITY): Payer: Medicare Other

## 2012-06-07 LAB — POTASSIUM: Potassium: 4 mEq/L (ref 3.5–5.1)

## 2012-06-07 LAB — PROTIME-INR: Prothrombin Time: 19.9 seconds — ABNORMAL HIGH (ref 11.6–15.2)

## 2012-06-08 LAB — PROTIME-INR: Prothrombin Time: 19.4 seconds — ABNORMAL HIGH (ref 11.6–15.2)

## 2012-06-09 ENCOUNTER — Other Ambulatory Visit (HOSPITAL_COMMUNITY): Payer: Medicare Other

## 2012-06-09 ENCOUNTER — Telehealth: Payer: Self-pay | Admitting: Internal Medicine

## 2012-06-09 ENCOUNTER — Institutional Professional Consult (permissible substitution) (HOSPITAL_COMMUNITY): Payer: Medicare Other

## 2012-06-09 LAB — BASIC METABOLIC PANEL
BUN: 60 mg/dL — ABNORMAL HIGH (ref 6–23)
BUN: 62 mg/dL — ABNORMAL HIGH (ref 6–23)
CO2: 22 mEq/L (ref 19–32)
Chloride: 112 mEq/L (ref 96–112)
Chloride: 108 mEq/L (ref 96–112)
Creatinine, Ser: 4.61 mg/dL — ABNORMAL HIGH (ref 0.50–1.35)
GFR calc Af Amer: 15 mL/min — ABNORMAL LOW (ref >90)
Glucose, Bld: 195 mg/dL — ABNORMAL HIGH (ref 70–99)
Potassium: 4.5 mEq/L (ref 3.5–5.1)
Potassium: 4.4 mEq/L (ref 3.5–5.1)

## 2012-06-09 LAB — URINALYSIS, ROUTINE W REFLEX MICROSCOPIC
Bilirubin Urine: NEGATIVE
Glucose, UA: NEGATIVE mg/dL
Ketones, ur: NEGATIVE mg/dL
Protein, ur: NEGATIVE mg/dL

## 2012-06-09 LAB — URINE MICROSCOPIC-ADD ON

## 2012-06-09 LAB — CBC
HCT: 42.2 % (ref 39.0–52.0)
Hemoglobin: 13.9 g/dL (ref 13.0–17.0)
WBC: 15.6 10*3/uL — ABNORMAL HIGH (ref 4.0–10.5)

## 2012-06-09 LAB — PROTIME-INR: INR: 1.78 — ABNORMAL HIGH (ref 0.00–1.49)

## 2012-06-09 NOTE — Telephone Encounter (Signed)
Slate called to let you know that his dad had a stoke on 6/19 They took him to [facility]  [facility] is now in [facility]  Off vent has tract  Move to regular room yesterday.  weaning off o2  Patient getting pt  Yesterday in normal sinus rhythm  Not discussed pace maker.  Right side paralysis. Son stated doing very well.

## 2012-06-10 ENCOUNTER — Other Ambulatory Visit (HOSPITAL_COMMUNITY): Payer: Medicare Other

## 2012-06-10 LAB — BASIC METABOLIC PANEL
BUN: 70 mg/dL — ABNORMAL HIGH (ref 6–23)
CO2: 22 mEq/L (ref 19–32)
Chloride: 100 mEq/L (ref 96–112)
Glucose, Bld: 215 mg/dL — ABNORMAL HIGH (ref 70–99)
Potassium: 3.9 mEq/L (ref 3.5–5.1)

## 2012-06-10 LAB — CBC
HCT: 35.4 % — ABNORMAL LOW (ref 39.0–52.0)
Hemoglobin: 11.6 g/dL — ABNORMAL LOW (ref 13.0–17.0)
MCH: 27.8 pg (ref 26.0–34.0)
MCHC: 32.8 g/dL (ref 30.0–36.0)
MCV: 84.9 fL (ref 78.0–100.0)
RBC: 4.17 MIL/uL — ABNORMAL LOW (ref 4.22–5.81)

## 2012-06-10 LAB — URINE MICROSCOPIC-ADD ON

## 2012-06-10 LAB — URINALYSIS, ROUTINE W REFLEX MICROSCOPIC
Bilirubin Urine: NEGATIVE
Nitrite: NEGATIVE
Protein, ur: NEGATIVE mg/dL
Specific Gravity, Urine: 1.016 (ref 1.005–1.030)
Urobilinogen, UA: 0.2 mg/dL (ref 0.0–1.0)

## 2012-06-11 LAB — BASIC METABOLIC PANEL
Calcium: 8.3 mg/dL — ABNORMAL LOW (ref 8.4–10.5)
GFR calc Af Amer: 12 mL/min — ABNORMAL LOW (ref >90)
GFR calc non Af Amer: 11 mL/min — ABNORMAL LOW (ref >90)
Potassium: 3.8 mEq/L (ref 3.5–5.1)
Sodium: 138 mEq/L (ref 135–145)

## 2012-06-11 LAB — PROTIME-INR: Prothrombin Time: 25.8 seconds — ABNORMAL HIGH (ref 11.6–15.2)

## 2012-06-12 LAB — URINE CULTURE: Colony Count: >=100000

## 2012-06-12 LAB — BASIC METABOLIC PANEL
CO2: 21 mEq/L (ref 19–32)
Calcium: 8.3 mg/dL — ABNORMAL LOW (ref 8.4–10.5)
Creatinine, Ser: 5.02 mg/dL — ABNORMAL HIGH (ref 0.50–1.35)
GFR calc non Af Amer: 11 mL/min — ABNORMAL LOW (ref >90)
Glucose, Bld: 166 mg/dL — ABNORMAL HIGH (ref 70–99)
Sodium: 142 mEq/L (ref 135–145)

## 2012-06-12 LAB — CBC
MCH: 27.9 pg (ref 26.0–34.0)
MCHC: 32.9 g/dL (ref 30.0–36.0)
MCV: 84.7 fL (ref 78.0–100.0)
Platelets: 199 10*3/uL (ref 150–400)
RBC: 4.05 MIL/uL — ABNORMAL LOW (ref 4.22–5.81)

## 2012-06-12 LAB — PROTIME-INR: Prothrombin Time: 27.8 seconds — ABNORMAL HIGH (ref 11.6–15.2)

## 2012-06-13 ENCOUNTER — Other Ambulatory Visit (HOSPITAL_COMMUNITY): Payer: Medicare Other

## 2012-06-13 LAB — BASIC METABOLIC PANEL
BUN: 79 mg/dL — ABNORMAL HIGH (ref 6–23)
CO2: 17 mEq/L — ABNORMAL LOW (ref 19–32)
Glucose, Bld: 164 mg/dL — ABNORMAL HIGH (ref 70–99)
Potassium: 4.8 mEq/L (ref 3.5–5.1)
Sodium: 143 mEq/L (ref 135–145)

## 2012-06-13 LAB — C-REACTIVE PROTEIN: CRP: 14.2 mg/dL — ABNORMAL HIGH (ref <0.60)

## 2012-06-13 LAB — PREALBUMIN: Prealbumin: 14.7 mg/dL — ABNORMAL LOW (ref 17.0–34.0)

## 2012-06-13 NOTE — Progress Notes (Signed)
Name: Isaac Leonard MRN: [id number] DOB: Apr 14, 1946    LOS: 28  Referring Provider:  [facility] Reason for Referral: [facility]  PULMONARY / CRITICAL CARE MEDICINE  HPI:  66 yo wm who was found in parking lot 05/06/12 unresponsive and transported to [facility]. He was notable for Rt hemiplegia, Lt Thalamic infarct and required intubation to protect airway. He self extubated but could not protect his airway. He has known Atrial fibrillation on coumadin but INR was sub therapeutic on admit to [facility] and stroke was presumed embolic. He became septic with enterococcus UTI and required pressors and abx. Transferred to [facility] 6/24 on full ventilatory support via oral ET. PCCM asked to evaluate and manage ventilator.       EVENTS  7/1 - Now s/p trach. On PSV 4h. RASS -3, gag +. [name] despite lack of sedation other thaan seroquel, zoloft scheduled and oxycodone prn, and morphine  7/5 - 1st day on  ATC 7/15 - 48 hours on t- collar 7/22 - tolerating Cap trach during day  Current Status: Follows commands. No c/o. Trach capped.     Vital Signs: Vital signs reviewed.  Physical Examination: General:  Obese male, NAD Neuro:  No follows commands. Rt hemiplegia ,dense. Lt side strong. RASS - 3 HEENT:  PERL, no track or follow Neck:no JVD Cardiovascular:  s1s2 distant, no m/r/g Lungs:  resp's even/non-labored with trach capped, lungs bilaterally clear.  Noted episode of apnea with sleep aprox 5-7 sec Abdomen:  Obese + bs Musculoskeletal:  intact Skin:  Warm + lower ext edema    LAB DATA  Lab 06/13/12 0530 06/12/12 0544 06/11/12 0554  NA 143 142 138  K 4.8 4.3 3.8  CL 109 107 101  CO2 17* 21 20  BUN 79* 77* 76*  CREATININE 5.21* 5.02* 5.09*  GLUCOSE 164* 166* 190*    Lab 06/12/12 0544 06/10/12 0529 06/09/12 0600  HGB 11.3* 11.6* 13.9  HCT 34.3* 35.4* 42.2  WBC 10.4 13.3* 15.6*  PLT 199 188 233    ABG    Component Value Date/Time   PHART 7.405 05/16/2012 1932   PCO2ART 32.1* 05/16/2012 1932   PO2ART 107.0* 05/16/2012 1932   HCO3 19.7* 05/16/2012 1932   TCO2 20.7 05/16/2012 1932   ACIDBASEDEF 4.1* 05/16/2012 1932   O2SAT 97.6 05/16/2012 1932   Dg Chest Port 1 View  06/13/2012  *RADIOLOGY REPORT*  Clinical Data: Ventilated patient, evaluate for infiltrates.  PORTABLE CHEST - 1 VIEW  Comparison: Most recent prior chest x-ray 05/10/2012  Findings: Single frontal view of the chest.  Tracheostomy tube remains in good position, the tip is midline at the level of the clavicles.  No significant interval change in the appearance of the chest.  There is persistent enlargement of the cardiopericardial silhouette.  Streaky bilateral retrocardiac opacities are favored to reflect atelectasis.  Inspiratory volumes remain low.  Osseous structures are intact.  IMPRESSION:  1.  Unchanged satisfactory position of tracheostomy tube. 2.  No significant interval change in the appearance of the chest. Specifically, low inspiratory volumes, bibasilar atelectasis and enlargement of the cardiopericardial silhouette are unchanged.  Original Report Authenticated By: HEATH     ASSESSMENT AND PLAN VDRF secondary to Embolic Stroke, Rt hemiplegia, AMS  PLAN - routine trach care and weaning protocol - Doubt that he can safely be decannulated but will see how he progresses.  - diuresis per primary - abx per primary - pccm see on mondays only   Embolic stroke with sub therapeutic INR -PT /  OT -mobilize as able   Cardiomyopathy Afib on Coumadin 7/1 ECHO>>>Left ventricle: Left ventricular diastolic function parameters were normal. Left atrium: The atrium was mildly dilated.OVerall LVF appears reduced with EF estimated at 40-45% withanterospetal hypokinesis.   Plan: -diuresis per primary -coumadin per pharmacy   Renal insufficiency  P: Per IM service.    Canary Brim, NP-C [place] [facility] Pgr: [phone] or [phone]    Billy Fischer, MD ; [facility] service Mobile [phone].  After 5:30  PM or weekends, call [phone]

## 2012-06-14 LAB — ALBUMIN: Albumin: 2.7 g/dL — ABNORMAL LOW (ref 3.5–5.2)

## 2012-06-14 LAB — CBC WITH DIFFERENTIAL/PLATELET
Basophils Absolute: 0 10*3/uL (ref 0.0–0.1)
Eosinophils Relative: 4 % (ref 0–5)
HCT: 35.9 % — ABNORMAL LOW (ref 39.0–52.0)
Hemoglobin: 11.8 g/dL — ABNORMAL LOW (ref 13.0–17.0)
Lymphocytes Relative: 25 % (ref 12–46)
Lymphs Abs: 2.5 10*3/uL (ref 0.7–4.0)
MCV: 86.1 fL (ref 78.0–100.0)
Monocytes Absolute: 0.7 10*3/uL (ref 0.1–1.0)
Monocytes Relative: 7 % (ref 3–12)
Neutro Abs: 6.3 10*3/uL (ref 1.7–7.7)
WBC: 9.8 10*3/uL (ref 4.0–10.5)

## 2012-06-14 LAB — BASIC METABOLIC PANEL
BUN: 82 mg/dL — ABNORMAL HIGH (ref 6–23)
CO2: 20 mEq/L (ref 19–32)
Calcium: 8.7 mg/dL (ref 8.4–10.5)
Chloride: 112 mEq/L (ref 96–112)
Creatinine, Ser: 5.65 mg/dL — ABNORMAL HIGH (ref 0.50–1.35)
Glucose, Bld: 160 mg/dL — ABNORMAL HIGH (ref 70–99)

## 2012-06-14 LAB — MAGNESIUM: Magnesium: 2.5 mg/dL (ref 1.5–2.5)

## 2012-06-14 LAB — PROTIME-INR: INR: 3.58 — ABNORMAL HIGH (ref 0.00–1.49)

## 2012-06-15 LAB — RENAL FUNCTION PANEL
Albumin: 2.8 g/dL — ABNORMAL LOW (ref 3.5–5.2)
BUN: 89 mg/dL — ABNORMAL HIGH (ref 6–23)
GFR calc non Af Amer: 10 mL/min — ABNORMAL LOW (ref >90)
Phosphorus: 5.7 mg/dL — ABNORMAL HIGH (ref 2.3–4.6)
Potassium: 4.6 mEq/L (ref 3.5–5.1)

## 2012-06-15 LAB — PROTIME-INR
INR: 3.81 — ABNORMAL HIGH (ref 0.00–1.49)
Prothrombin Time: 38.1 seconds — ABNORMAL HIGH (ref 11.6–15.2)

## 2012-06-16 LAB — CBC WITH DIFFERENTIAL/PLATELET
Basophils Absolute: 0 10*3/uL (ref 0.0–0.1)
Basophils Relative: 0 % (ref 0–1)
Eosinophils Relative: 4 % (ref 0–5)
Lymphocytes Relative: 31 % (ref 12–46)
MCV: 86.8 fL (ref 78.0–100.0)
Platelets: 299 10*3/uL (ref 150–400)
RDW: 15.3 % (ref 11.5–15.5)
WBC: 10.5 10*3/uL (ref 4.0–10.5)

## 2012-06-16 LAB — BASIC METABOLIC PANEL
CO2: 26 mEq/L (ref 19–32)
Calcium: 9.3 mg/dL (ref 8.4–10.5)
Creatinine, Ser: 4.18 mg/dL — ABNORMAL HIGH (ref 0.50–1.35)
GFR calc Af Amer: 16 mL/min — ABNORMAL LOW (ref >90)

## 2012-06-16 LAB — PROTIME-INR
INR: 3.24 — ABNORMAL HIGH (ref 0.00–1.49)
Prothrombin Time: 33.6 seconds — ABNORMAL HIGH (ref 11.6–15.2)

## 2012-06-17 LAB — PROTIME-INR: Prothrombin Time: 30.2 seconds — ABNORMAL HIGH (ref 11.6–15.2)

## 2012-06-17 LAB — BASIC METABOLIC PANEL
BUN: 74 mg/dL — ABNORMAL HIGH (ref 6–23)
Calcium: 9.3 mg/dL (ref 8.4–10.5)
Creatinine, Ser: 3.33 mg/dL — ABNORMAL HIGH (ref 0.50–1.35)
GFR calc Af Amer: 21 mL/min — ABNORMAL LOW (ref >90)
GFR calc non Af Amer: 18 mL/min — ABNORMAL LOW (ref >90)
Potassium: 4.1 mEq/L (ref 3.5–5.1)

## 2012-06-18 LAB — BASIC METABOLIC PANEL
BUN: 80 mg/dL — ABNORMAL HIGH (ref 6–23)
Calcium: 9 mg/dL (ref 8.4–10.5)
Creatinine, Ser: 2.85 mg/dL — ABNORMAL HIGH (ref 0.50–1.35)
GFR calc non Af Amer: 22 mL/min — ABNORMAL LOW (ref >90)
Glucose, Bld: 223 mg/dL — ABNORMAL HIGH (ref 70–99)
Sodium: 147 mEq/L — ABNORMAL HIGH (ref 135–145)

## 2012-06-20 DIAGNOSIS — E785 Hyperlipidemia, unspecified: Secondary | ICD-10-CM

## 2012-06-20 LAB — CBC
HCT: 40 % (ref 39.0–52.0)
Hemoglobin: 12.8 g/dL — ABNORMAL LOW (ref 13.0–17.0)
WBC: 12.2 10*3/uL — ABNORMAL HIGH (ref 4.0–10.5)

## 2012-06-20 LAB — BASIC METABOLIC PANEL WITH GFR
BUN: 71 mg/dL — ABNORMAL HIGH (ref 6–23)
CO2: 28 meq/L (ref 19–32)
Calcium: 9.1 mg/dL (ref 8.4–10.5)
Chloride: 105 meq/L (ref 96–112)
Creatinine, Ser: 2.19 mg/dL — ABNORMAL HIGH (ref 0.50–1.35)
GFR calc Af Amer: 34 mL/min — ABNORMAL LOW
GFR calc non Af Amer: 30 mL/min — ABNORMAL LOW
Glucose, Bld: 190 mg/dL — ABNORMAL HIGH (ref 70–99)
Potassium: 3.7 meq/L (ref 3.5–5.1)
Sodium: 145 meq/L (ref 135–145)

## 2012-06-20 LAB — CLOSTRIDIUM DIFFICILE BY PCR: Toxigenic C. Difficile by PCR: NEGATIVE

## 2012-06-20 LAB — PROTIME-INR
INR: 2.3 — ABNORMAL HIGH (ref 0.00–1.49)
Prothrombin Time: 25.7 s — ABNORMAL HIGH (ref 11.6–15.2)

## 2012-06-20 LAB — C-REACTIVE PROTEIN: CRP: 3.4 mg/dL — ABNORMAL HIGH

## 2012-06-20 NOTE — Progress Notes (Signed)
Name: Isaac Leonard MRN: [id number] DOB: 01-11-1946    LOS: 35  Referring Provider:  [facility] Reason for Referral: [facility]  PULMONARY / CRITICAL CARE MEDICINE  HPI:  66 yo wm who was found in parking lot 05/06/12 unresponsive and transported to [facility]. He was notable for Rt hemiplegia, Lt Thalamic infarct and required intubation to protect airway. He self extubated but could not protect his airway. He has known Atrial fibrillation on coumadin but INR was sub therapeutic on admit to [facility] and stroke was presumed embolic. He became septic with enterococcus UTI and required pressors and abx. Transferred to [facility] 6/24 on full ventilatory support via oral ET. PCCM asked to evaluate and manage ventilator.       EVENTS  7/1 - Now s/p trach. On PSV 4h. RASS -3, gag +. Isaac Leonard despite lack of sedation other thaan seroquel, zoloft scheduled and oxycodone prn, and morphine  7/5 - 1st day on  ATC 7/15 - 48 hours on t- collar 7/22 - tolerating Cap trach during day  Current Status: Follows commands. No c/o. Trach capped daytime.  Aspirated over weekend per RT.  No resp distress.    Vital Signs: Vital signs reviewed.  Physical Examination: General:  Obese male, NAD Neuro:  No follows commands. Rt hemiplegia ,dense. Lt side strong. RASS - 3 HEENT:  PERL, no track or follow Neck:no JVD Cardiovascular:  s1s2 distant, no m/r/g Lungs:  resp's even/non-labored with trach capped, lungs bilaterally clear Abdomen:  Obese + bs Musculoskeletal:  intact Skin:  Warm + lower ext edema    LAB DATA  Lab 06/20/12 0700 06/18/12 1430 06/17/12 0546  NA 145 147* 155*  K 3.7 4.2 4.1  CL 105 107 114*  CO2 28 25 25   BUN 71* 80* 74*  CREATININE 2.19* 2.85* 3.33*  GLUCOSE 190* 223* 182*    Lab 06/20/12 0700 06/16/12 0613 06/14/12 0505  HGB 12.8* 13.1 11.8*  HCT 40.0 40.2 35.9*  WBC 12.2* 10.5 9.8  PLT 259 299 239    ABG    Component Value Date/Time   PHART 7.405 05/16/2012 1932   PCO2ART 32.1* 05/16/2012 1932    PO2ART 107.0* 05/16/2012 1932   HCO3 19.7* 05/16/2012 1932   TCO2 20.7 05/16/2012 1932   ACIDBASEDEF 4.1* 05/16/2012 1932   O2SAT 97.6 05/16/2012 1932   No results found.   ASSESSMENT AND PLAN VDRF secondary to Embolic Stroke, Rt hemiplegia, AMS  PLAN - routine trach care and weaning protocol -cont capped daytime as tol and qhs ATC.  - Doubt that he can safely be decannulated as of yet with recent events, npo status - diuresis per primary, crt improving - abx per primary -speech therapy -- ?aspirating  - intermittent f/u CXR  - pccm see on mondays only -neuro progression will guide decan in future; re eval in 1 week after further asp issues  Embolic stroke with sub therapeutic INR -PT / OT -mobilize as able  Cardiomyopathy Afib on Coumadin 7/1 ECHO>>>Left ventricle: Left ventricular diastolic function parameters were normal. Left atrium: The atrium was mildly dilated.OVerall LVF appears reduced with EF estimated at 40-45% withanterospetal hypokinesis.   Plan: -diuresis per primary, crt improved -coumadin per pharmacy   Renal insufficiency  P: Per IM service.   Isaac Leonard. Isaac Alias, MD, FACP Pgr: [phone] [place] [facility]

## 2012-06-21 LAB — PROTIME-INR
INR: 1.9 — ABNORMAL HIGH (ref 0.00–1.49)
Prothrombin Time: 22.1 seconds — ABNORMAL HIGH (ref 11.6–15.2)

## 2012-06-21 LAB — SODIUM: Sodium: 146 mEq/L — ABNORMAL HIGH (ref 135–145)

## 2012-06-22 LAB — PROTIME-INR: INR: 1.81 — ABNORMAL HIGH (ref 0.00–1.49)

## 2012-06-22 LAB — CBC
Hemoglobin: 12.5 g/dL — ABNORMAL LOW (ref 13.0–17.0)
MCH: 28 pg (ref 26.0–34.0)
MCHC: 32.4 g/dL (ref 30.0–36.0)
MCV: 86.5 fL (ref 78.0–100.0)
RBC: 4.46 MIL/uL (ref 4.22–5.81)

## 2012-06-22 LAB — BASIC METABOLIC PANEL
BUN: 62 mg/dL — ABNORMAL HIGH (ref 6–23)
CO2: 27 mEq/L (ref 19–32)
Calcium: 9.2 mg/dL (ref 8.4–10.5)
Creatinine, Ser: 1.79 mg/dL — ABNORMAL HIGH (ref 0.50–1.35)
GFR calc non Af Amer: 38 mL/min — ABNORMAL LOW (ref >90)
Glucose, Bld: 151 mg/dL — ABNORMAL HIGH (ref 70–99)

## 2012-06-23 LAB — BASIC METABOLIC PANEL
BUN: 57 mg/dL — ABNORMAL HIGH (ref 6–23)
CO2: 30 mEq/L (ref 19–32)
Calcium: 9.3 mg/dL (ref 8.4–10.5)
Chloride: 104 mEq/L (ref 96–112)
Creatinine, Ser: 1.82 mg/dL — ABNORMAL HIGH (ref 0.50–1.35)
Glucose, Bld: 169 mg/dL — ABNORMAL HIGH (ref 70–99)

## 2012-06-24 LAB — PROTIME-INR: Prothrombin Time: 24 seconds — ABNORMAL HIGH (ref 11.6–15.2)

## 2012-06-25 LAB — PROTIME-INR: INR: 2.19 — ABNORMAL HIGH (ref 0.00–1.49)

## 2012-06-25 LAB — BASIC METABOLIC PANEL
CO2: 27 mEq/L (ref 19–32)
Calcium: 9.5 mg/dL (ref 8.4–10.5)
Chloride: 104 mEq/L (ref 96–112)
Potassium: 3.7 mEq/L (ref 3.5–5.1)
Sodium: 144 mEq/L (ref 135–145)

## 2012-06-26 LAB — PROTIME-INR
INR: 2.6 — ABNORMAL HIGH (ref 0.00–1.49)
Prothrombin Time: 28.3 seconds — ABNORMAL HIGH (ref 11.6–15.2)

## 2012-06-27 LAB — CBC
HCT: 40.4 % (ref 39.0–52.0)
Hemoglobin: 12.9 g/dL — ABNORMAL LOW (ref 13.0–17.0)
MCH: 27.7 pg (ref 26.0–34.0)
MCHC: 31.9 g/dL (ref 30.0–36.0)

## 2012-06-27 LAB — C-REACTIVE PROTEIN: CRP: 4.5 mg/dL — ABNORMAL HIGH (ref <0.60)

## 2012-06-27 LAB — PROTIME-INR: Prothrombin Time: 27.3 seconds — ABNORMAL HIGH (ref 11.6–15.2)

## 2012-06-27 LAB — BASIC METABOLIC PANEL
BUN: 56 mg/dL — ABNORMAL HIGH (ref 6–23)
Calcium: 9.2 mg/dL (ref 8.4–10.5)
Chloride: 102 mEq/L (ref 96–112)
Creatinine, Ser: 1.64 mg/dL — ABNORMAL HIGH (ref 0.50–1.35)
GFR calc Af Amer: 49 mL/min — ABNORMAL LOW (ref >90)

## 2012-06-27 NOTE — Progress Notes (Signed)
Name: Isaac Leonard MRN: [id number] DOB: 27-Jan-1946    LOS: 42  Referring Provider:  [facility] Reason for Referral: [facility]  PULMONARY / CRITICAL CARE MEDICINE  HPI:  66 yo wm who was found in parking lot 05/06/12 unresponsive and transported to [facility]. He was notable for Rt hemiplegia, Lt Thalamic infarct and required intubation to protect airway. He self extubated but could not protect his airway. He has known Atrial fibrillation on coumadin but INR was sub therapeutic on admit to [facility] and stroke was presumed embolic. He became septic with enterococcus UTI and required pressors and abx. Transferred to [facility] 6/24 on full ventilatory support via oral ET. PCCM asked to evaluate and manage ventilator.       EVENTS  7/1 - Now s/p trach. On PSV 4h. RASS -3, gag +. [name] despite lack of sedation other thaan seroquel, zoloft scheduled and oxycodone prn, and morphine  7/5 - 1st day on  ATC 7/15 - 48 hours on t- collar 7/22 - tolerating Cap trach during day  Current Status: Follows commands. No c/o. Trach capped daytime.  Aspirated over weekend per RT.  No resp distress.    Vital Signs: Vital signs reviewed.  Physical Examination: General:  Obese male, NAD Neuro:  No follows commands. Rt hemiplegia ,dense. Lt side strong. RASS - 3 HEENT:  PERL, no track or follow Neck:no JVD Cardiovascular:  s1s2 distant, no m/r/g Lungs:  resp's even/non-labored with trach capped, lungs bilaterally clear Abdomen:  Obese + bs Musculoskeletal:  intact Skin:  Warm + lower ext edema    LAB DATA  Lab 06/27/12 0515 06/25/12 0543 06/23/12 0630  NA 142 144 144  K 3.7 3.7 4.0  CL 102 104 104  CO2 30 27 30   BUN 56* 53* 57*  CREATININE 1.64* 1.59* 1.82*  GLUCOSE 174* 149* 169*    Lab 06/27/12 0515 06/22/12 0522  HGB 12.9* 12.5*  HCT 40.4 38.6*  WBC 14.4* 12.4*  PLT 222 237    ABG    Component Value Date/Time   PHART 7.405 05/16/2012 1932   PCO2ART 32.1* 05/16/2012 1932   PO2ART 107.0* 05/16/2012 1932     HCO3 19.7* 05/16/2012 1932   TCO2 20.7 05/16/2012 1932   ACIDBASEDEF 4.1* 05/16/2012 1932   O2SAT 97.6 05/16/2012 1932   No results found.   ASSESSMENT AND PLAN VDRF secondary to Embolic Stroke, Rt hemiplegia, AMS  PLAN - routine trach care and weaning protocol -cont capped daytime as tol and qhs ATC.  - Doubt that he can safely be decannulated as of yet with recent events, npo status, capped trach - diuresis per primary, crt improving - abx per primary -speech therapy -- ?aspirating  - intermittent f/u CXR   -neuro progression will guide decan in future; re eval in 1 week after further asp issues  Embolic stroke with sub therapeutic INR -PT / OT -mobilize as able  Cardiomyopathy Afib on Coumadin 7/1 ECHO>>>Left ventricle: Left ventricular diastolic function parameters were normal. Left atrium: The atrium was mildly dilated.OVerall LVF appears reduced with EF estimated at 40-45% withanterospetal hypokinesis.   Plan: -diuresis per primary, crt improved -coumadin per pharmacy   Renal insufficiency  P: Per IM service.   Alyson Reedy, M.D. [facility] Pulmonary/Critical Care Medicine. Pager: [phone]. After hours pager: [phone].

## 2012-07-10 ENCOUNTER — Encounter (HOSPITAL_COMMUNITY): Payer: Self-pay | Admitting: *Deleted

## 2012-07-10 ENCOUNTER — Emergency Department (HOSPITAL_COMMUNITY)
Admission: EM | Admit: 2012-07-10 | Discharge: 2012-07-24 | Disposition: E | Payer: Medicare Other | Attending: Emergency Medicine | Admitting: Emergency Medicine

## 2012-07-10 DIAGNOSIS — I4891 Unspecified atrial fibrillation: Secondary | ICD-10-CM | POA: Insufficient documentation

## 2012-07-10 DIAGNOSIS — N189 Chronic kidney disease, unspecified: Secondary | ICD-10-CM | POA: Insufficient documentation

## 2012-07-10 DIAGNOSIS — I469 Cardiac arrest, cause unspecified: Secondary | ICD-10-CM | POA: Insufficient documentation

## 2012-07-10 NOTE — Progress Notes (Addendum)
Chaplain paged at 1743 and arrived in ED at 1800. [name] was already dead when I was paged. Family was in the consultation room with physicians and had been notified of the death. The family and I met in room WA16. [name] only son (only child) was deeply grieved, not only because of the death, but what the death will mean to his mother who is confined to a elder care facility. Because of her condition she was not told of her husband's death, a task which the son will carry out after he leaves. This will be especially hard because tomorrow Aug 19 is Mr. Kendall's anniversary.  [name] was a deeply religious person, and a practicing Christian. His community of faith did not send representatives at the son's suggestion.  Time was spent telling stories concerning [name]. In such stories the family grief began to flow and was comforted. Grief counsel given and received.  Son and his wife departed to take the new of [name]'s death to his wife. There will be no further visitors.  The family asked that thanks be given to the staff for their care. They acknowledged his condition was too advanced toward death to keep him alive. Because of this they appreciate our efforts all the more.  Isaac Leonard. Isaac Leonard, D.Min, M.Div, APC Chaplain

## 2012-07-10 NOTE — ED Provider Notes (Signed)
History     CSN: [id number]  Arrival date & time 07/07/2012  1709   First MD Initiated Contact with Patient 06/26/2012 1714      Chief Complaint  Patient presents with  . Cardiac Arrest     HPI He arrives with EMS performing active CPR with thumper device with 02 at 100% per trach. Collar (has had trach. Chronically). He is mottled, and is in persistent Asystole. CPR was begun at his nursing home at 1623 hours. He received a total of 5 doses of IV epinephrine during code.   Past Medical History  Diagnosis Date  . Atrial fibrillation     1st noted in 9/11. Now on coumadin. Cardioverted to NSR (08/28/10)  . Cardiomyopathy     with systolic CHF: uincertain etiolgogy. Could be tachycardia-mediated. Pt was admitted in 9/11 with CHF exacerbation Echo (9/11) EF < 25% severe global hypokinesis, RV severely dilated with moderate to severely decreased systolic function, mild MR (read by outside cardiologist). TEe (10/11): EF 35-40%, no LAA thrombus, RV normal size with mildly decreased systolic function, mild MR  . CKD (chronic kidney disease)     History reviewed. No pertinent past surgical history.  Family History  Problem Relation Age of Onset  . Heart failure Mother   . Prostate cancer Father     History  Substance Use Topics  . Smoking status: Unknown If Ever Smoked  . Smokeless tobacco: Not on file   Comment: tobacco use- no   . Alcohol Use: No      Review of Systems  Unable to perform ROS: Unstable vital signs    Allergies  Review of patient's allergies indicates not on file.  Home Medications   Current Outpatient Rx  Name Route Sig Dispense Refill  . ASPIRIN 81 MG PO TBEC Oral Take 81 mg by mouth daily.      Marland Kitchen CARVEDILOL 12.5 MG PO TABS  TAKE ONE TABLET BY MOUTH TWICE A DAY 60 tablet 6  . ENALAPRIL MALEATE 5 MG PO TABS  TAKE ONE TABLET BY MOUTH TWICE A DAY 60 tablet 2  . FUROSEMIDE 20 MG PO TABS Oral Take 20 mg by mouth. Take 1-2 tabs by mouth daily as needed for  2-3 lb weight gain     . ISOSORBIDE MONONITRATE ER 30 MG PO TB24 Oral Take 30 mg by mouth daily.      Marland Kitchen GAS-X PO Oral Take by mouth.      . WARFARIN SODIUM 5 MG PO TABS Oral Take 5 mg by mouth daily. Use as directed by Anticoagulation clinic       Wt 222 lb (100.699 kg)  Physical Exam  Nursing note and vitals reviewed. Constitutional: He appears well-developed.  HENT:  Head: Normocephalic and atraumatic.  Cardiovascular:  Pulses:      Carotid pulses are 0 on the right side, and 0 on the left side.      Asystole on monitor.  Ultrasound at bedside evaluated patient and showed no cardiac activity.  Patient pronounced dead   Pulmonary/Chest: Apnea noted.       Patient being bagged with bag-valve-mask through trach tube.  Breath sounds were present in both lung fields.  Abdominal: Normal appearance. He exhibits no distension.  Musculoskeletal: Normal range of motion.  Neurological: He is unresponsive. GCS eye subscore is 1. GCS verbal subscore is 1. GCS motor subscore is 1.  Skin: Skin is warm. No rash noted.    ED Course  Procedures (including critical care  time) CRITICAL CARE Performed by: Nelva Nay L   Total critical care time: 30 min  Critical care time was exclusive of separately billable procedures and treating other patients.  Critical care was necessary to treat or prevent imminent or life-threatening deterioration.  Critical care was time spent personally by me on the following activities: development of treatment plan with patient and/or surrogate as well as nursing, discussions with consultants, evaluation of patient's response to treatment, examination of patient, obtaining history from patient or surrogate, ordering and performing treatments and interventions, ordering and review of laboratory studies, ordering and review of radiographic studies, pulse oximetry and re-evaluation of patient's condition.  Labs Reviewed - No data to display No results found.   1.  Cardiac arrest       MDM          Nelia Shi, MD 07/12/12 2257

## 2012-07-10 NOTE — ED Notes (Signed)
Pt deceased.  Ready for transfer to morgue.

## 2012-07-10 NOTE — ED Notes (Addendum)
He arrives with EMS performing active CPR with thumper device with 02 at 100% per trach. Collar (has had trach. Chronically).  He is mottled, and is in persistent Asystole.  CPR was begun at his nursing home at 1623 hours.  He received a total of 5 doses of IV epinephrine during code.  Dr. Radford Pax meets pt. And pronounces him dead after performing u/s at 1705 hours.

## 2012-07-24 DEATH — deceased

## 2014-03-01 IMAGING — CR DG CHEST 1V PORT
1 series · 1 of 1 positions shown · non-contrast
Comparison: none

REASON FOR EXAM: sob
COMMENTS:

[portable]
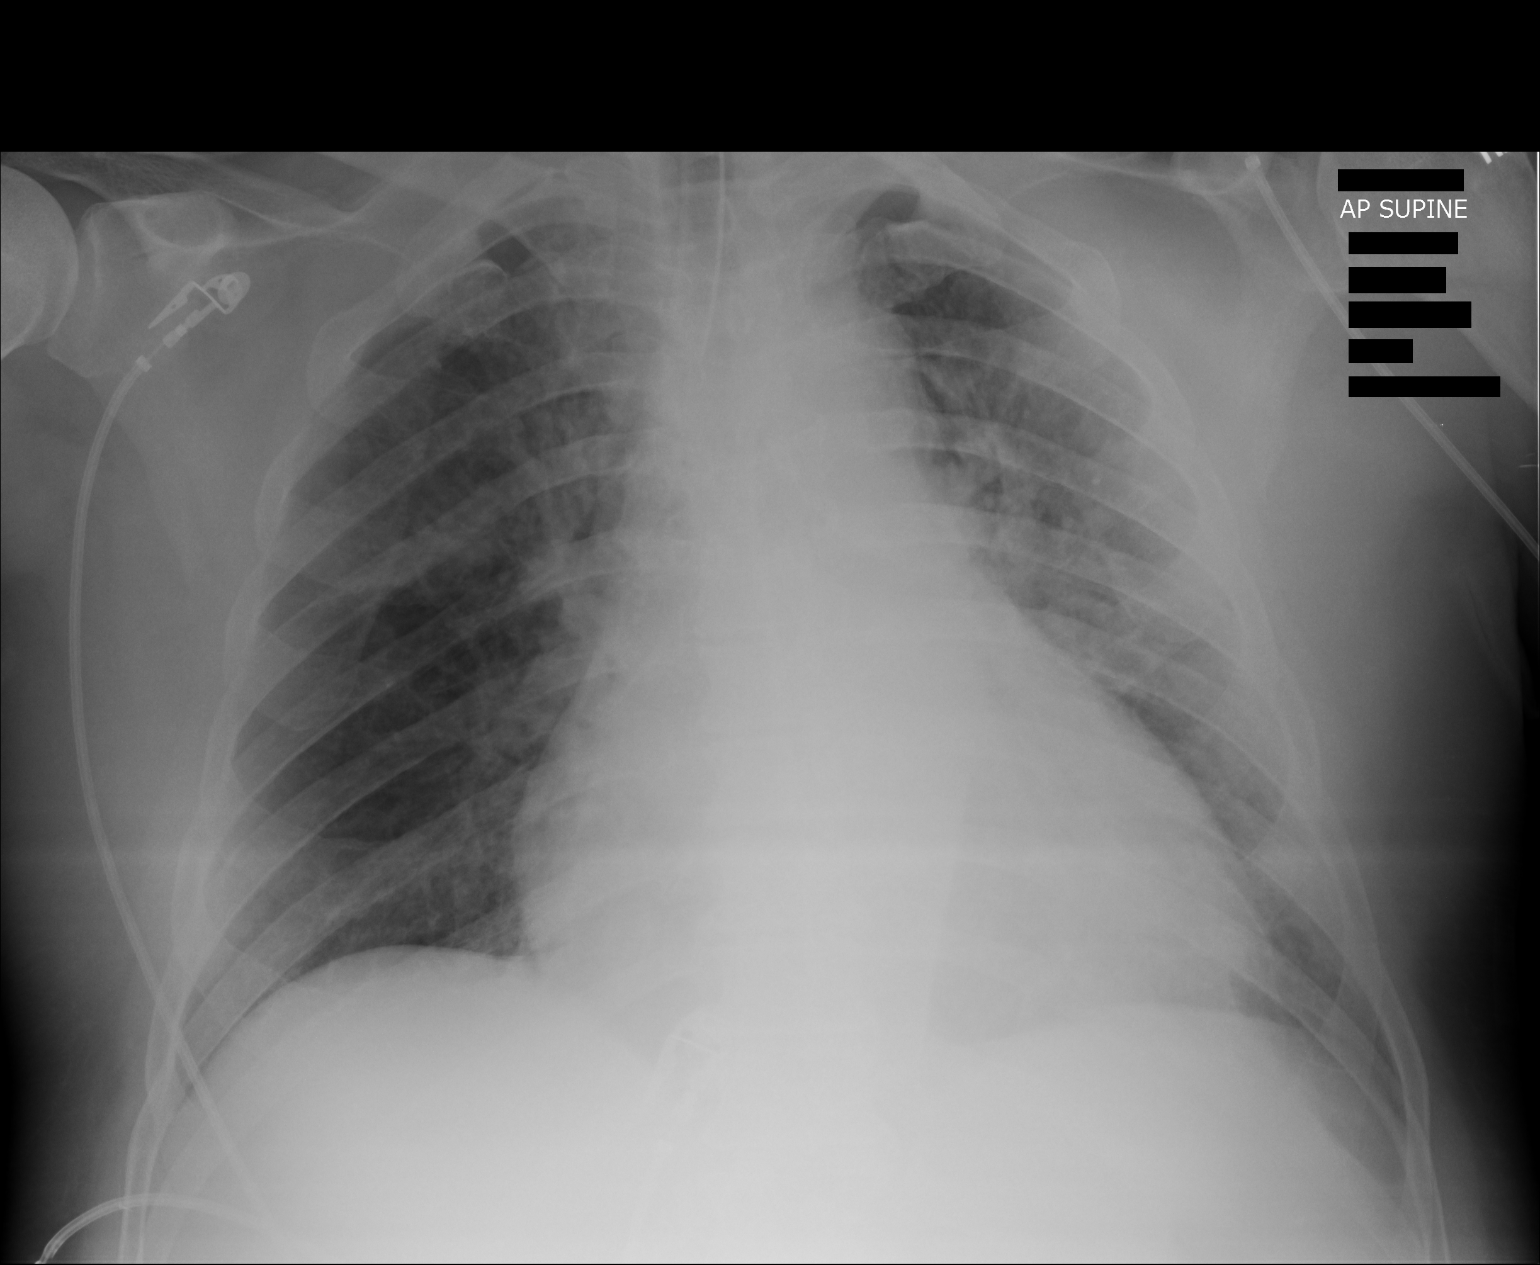

[1 of 1 positions shown; findings below may reference images not displayed]

PROCEDURE:     DXR - DXR PORTABLE CHEST SINGLE VIEW  - May 11, 2012  [DATE]

RESULT:     An endotracheal tube is present with the tip lying 1 cm below
the inferior margin of the clavicular heads. The lungs are well-expanded.
The interstitial markings are increased predominantly in the left upper lobe
but to a lesser extent in the right upper lobe. The cardiac silhouette is
enlarged.
IMPRESSION: There is increased interstitial density in the upper lobes
especially on the left. This may reflect pulmonary interstitial edema of
cardiac or noncardiac cause. Aspiration cannot be excluded. Continued
followup films are recommended.

[REDACTED]

## 2014-03-02 IMAGING — CR DG CHEST 1V PORT
1 series · 1 of 1 positions shown · non-contrast
Comparison: none

REASON FOR EXAM: CHF vs infiltrates
COMMENTS:   LMP: (Male)

[portable]
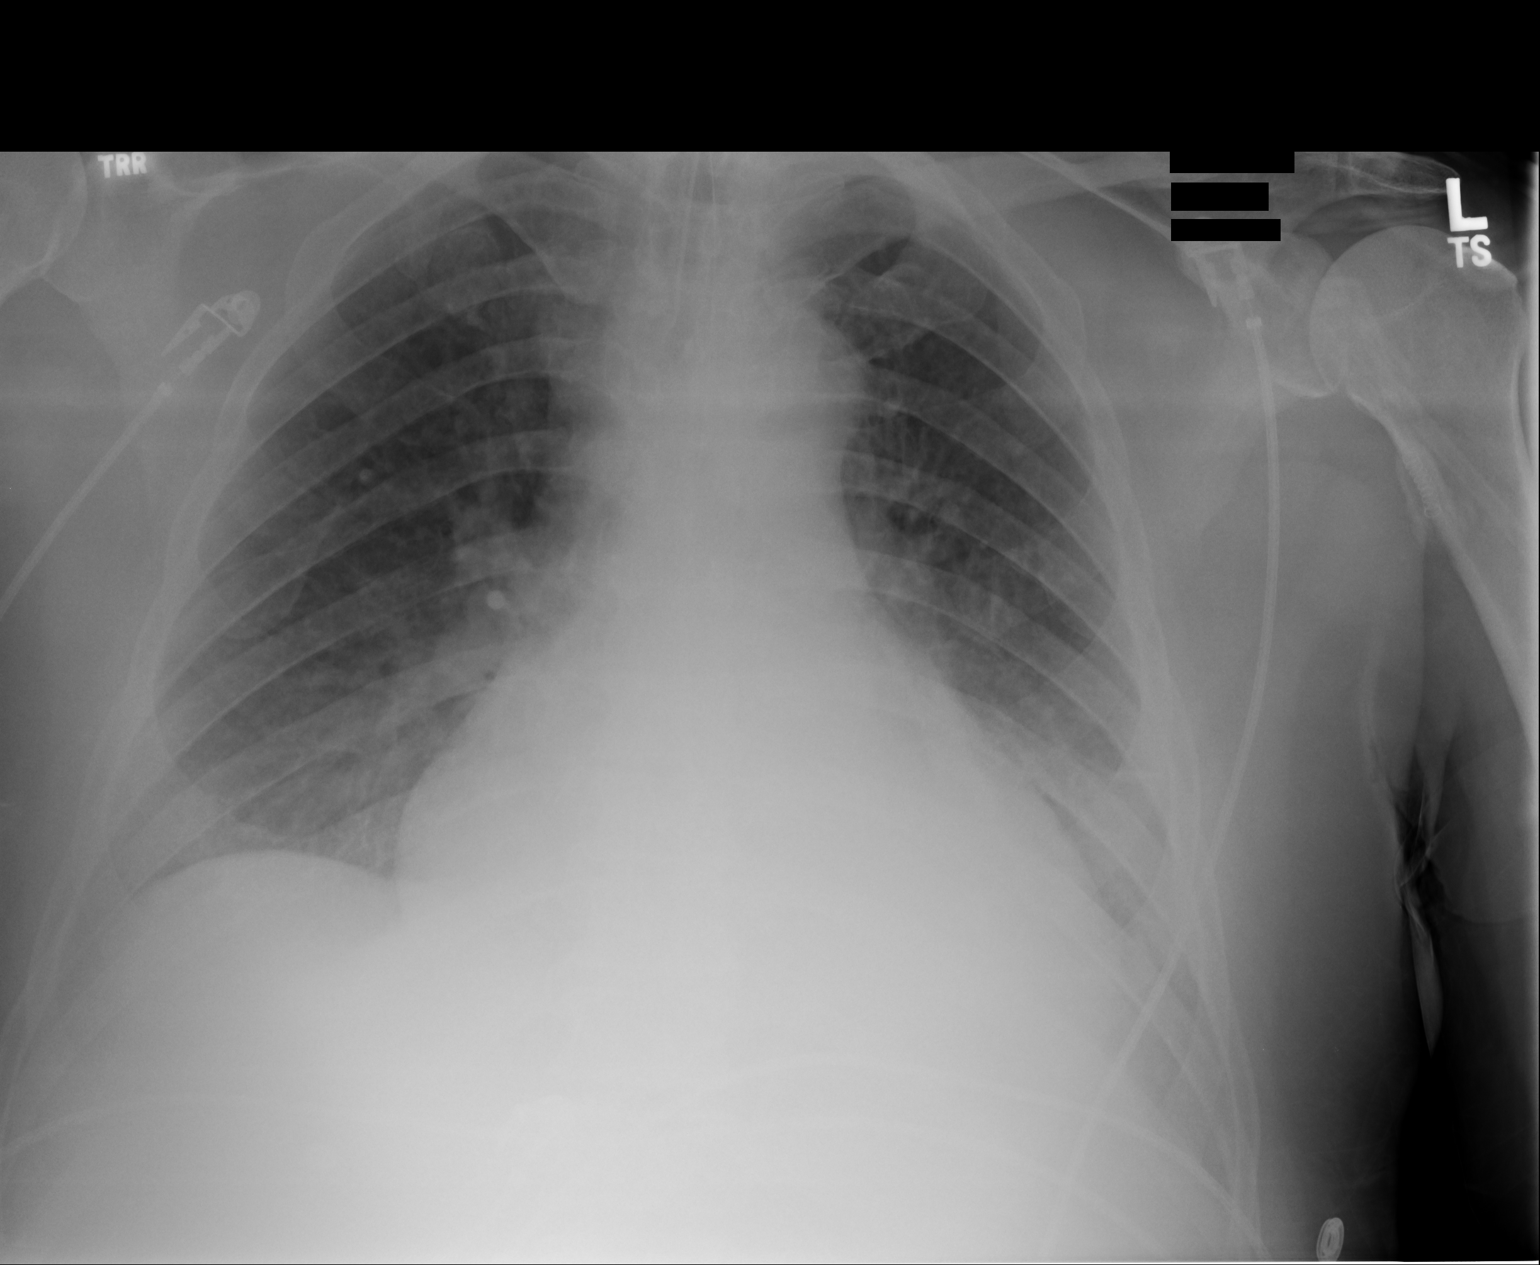

[1 of 1 positions shown; findings below may reference images not displayed]

PROCEDURE:     DXR - DXR PORTABLE CHEST SINGLE VIEW  - May 12, 2012  [DATE]

RESULT:     Comparison is made to study 11 May, 2012.

The lungs are adequately inflated. The endotracheal tube tip lies at the
level of the inferior margin of the clavicular heads. The cardiac silhouette
is mildly enlarged. The retrocardiac region on the left is slightly more
dense. The pulmonary interstitial markings have improved slightly especially
on the left.
IMPRESSION: There has not been dramatic interval change in the
appearance of the chest since yesterday's study. There has been slight
improvement in the pulmonary interstitium especially on the left. However,
the retrocardiac region on the left is slightly more dense with partial
obscuration of the hemidiaphragm.

## 2014-03-05 IMAGING — CR DG CHEST 1V PORT
1 series · 1 of 1 positions shown · non-contrast
Comparison: none

REASON FOR EXAM: reintubated yesterday, fever tonight
COMMENTS:

[ap]
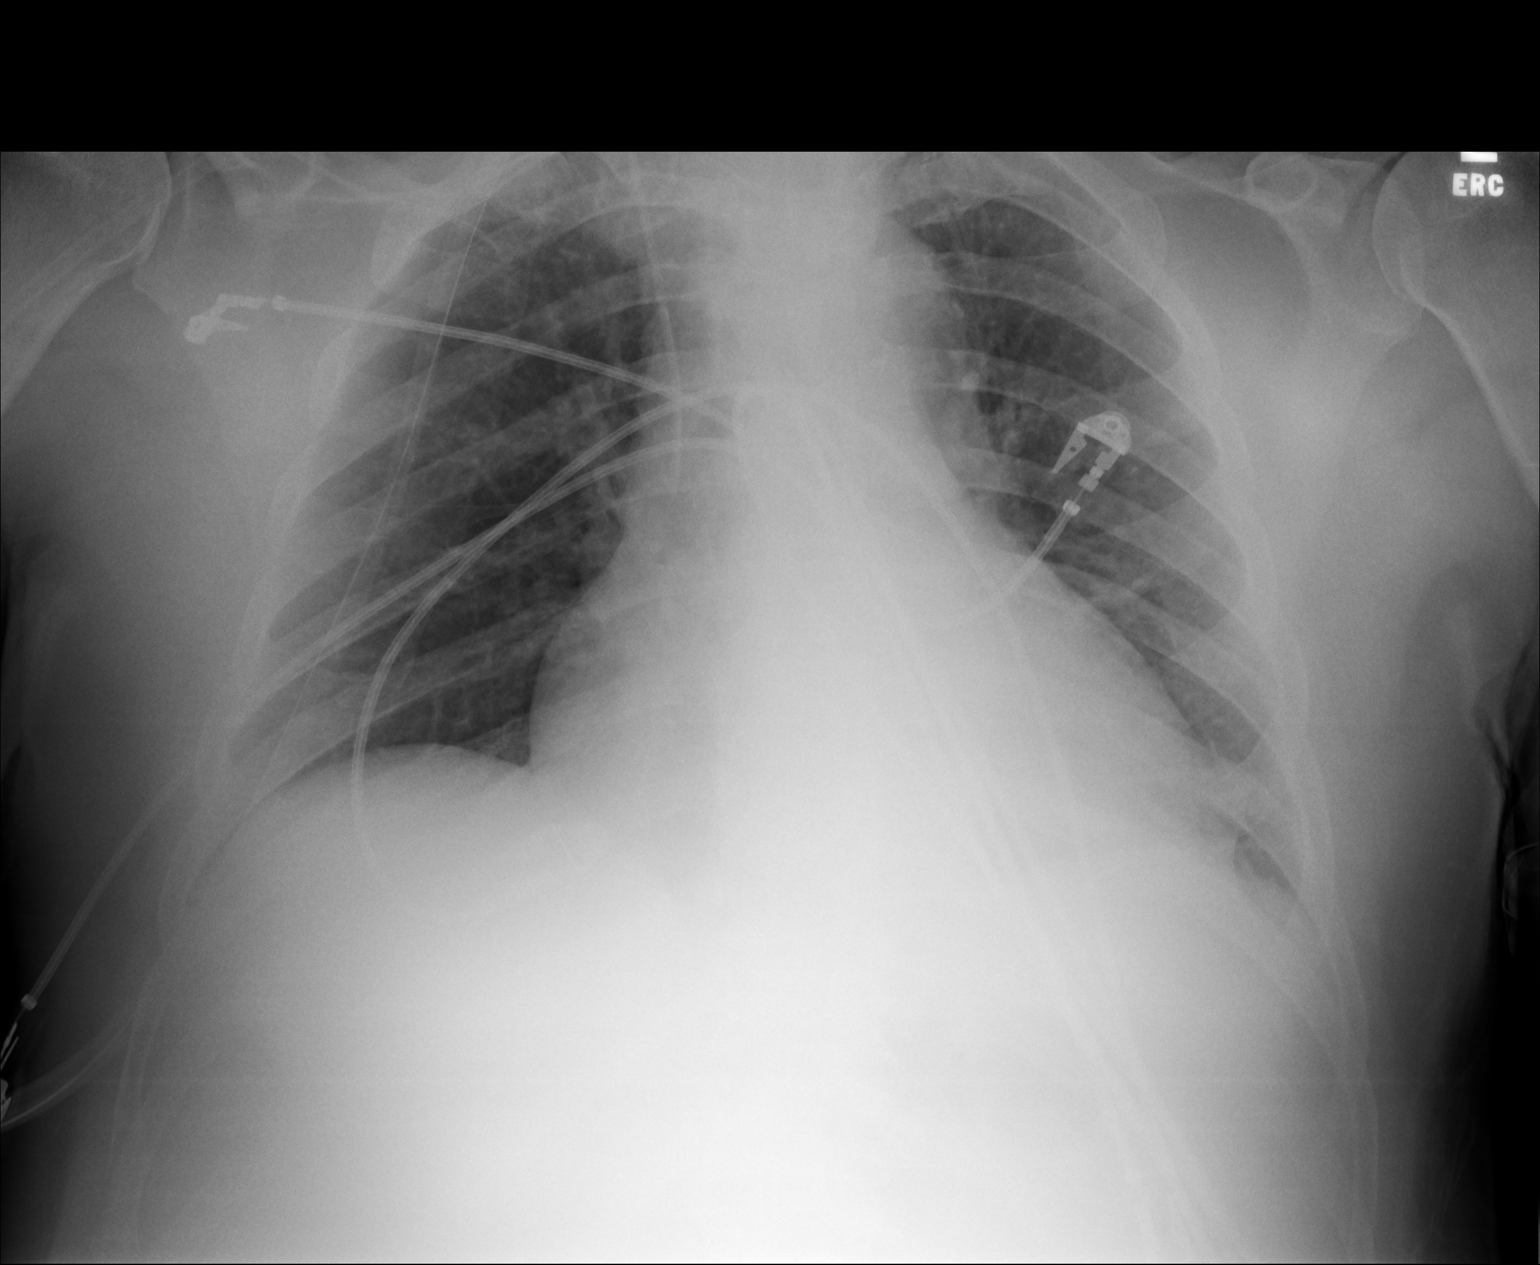

[1 of 1 positions shown; findings below may reference images not displayed]

PROCEDURE:     DXR - DXR PORTABLE CHEST SINGLE VIEW  - May 15, 2012  [DATE]

RESULT:     Comparison is made to the previous examination dated 13 May, 2012.

Endotracheal tube is present with the tip at the level of the
sternoclavicular joints. There is a right internal jugular central venous
catheter present with the tip in the superior vena cava. A nasogastric tube
passes below the diaphragm. The heart is mildly enlarged. The lungs appear
to be clear with shallow inspiration.
IMPRESSION: 1. No acute cardiopulmonary disease. Lines and tubes present as described.

[REDACTED]

## 2014-03-06 IMAGING — CR DG CHEST 1V PORT
1 series · 2 of 2 positions shown · non-contrast
Comparison: none

REASON FOR EXAM: on vent
COMMENTS:

[Series 1: portable · 0.17mm/px · 2 of 2 slices shown]
[im 1/2]
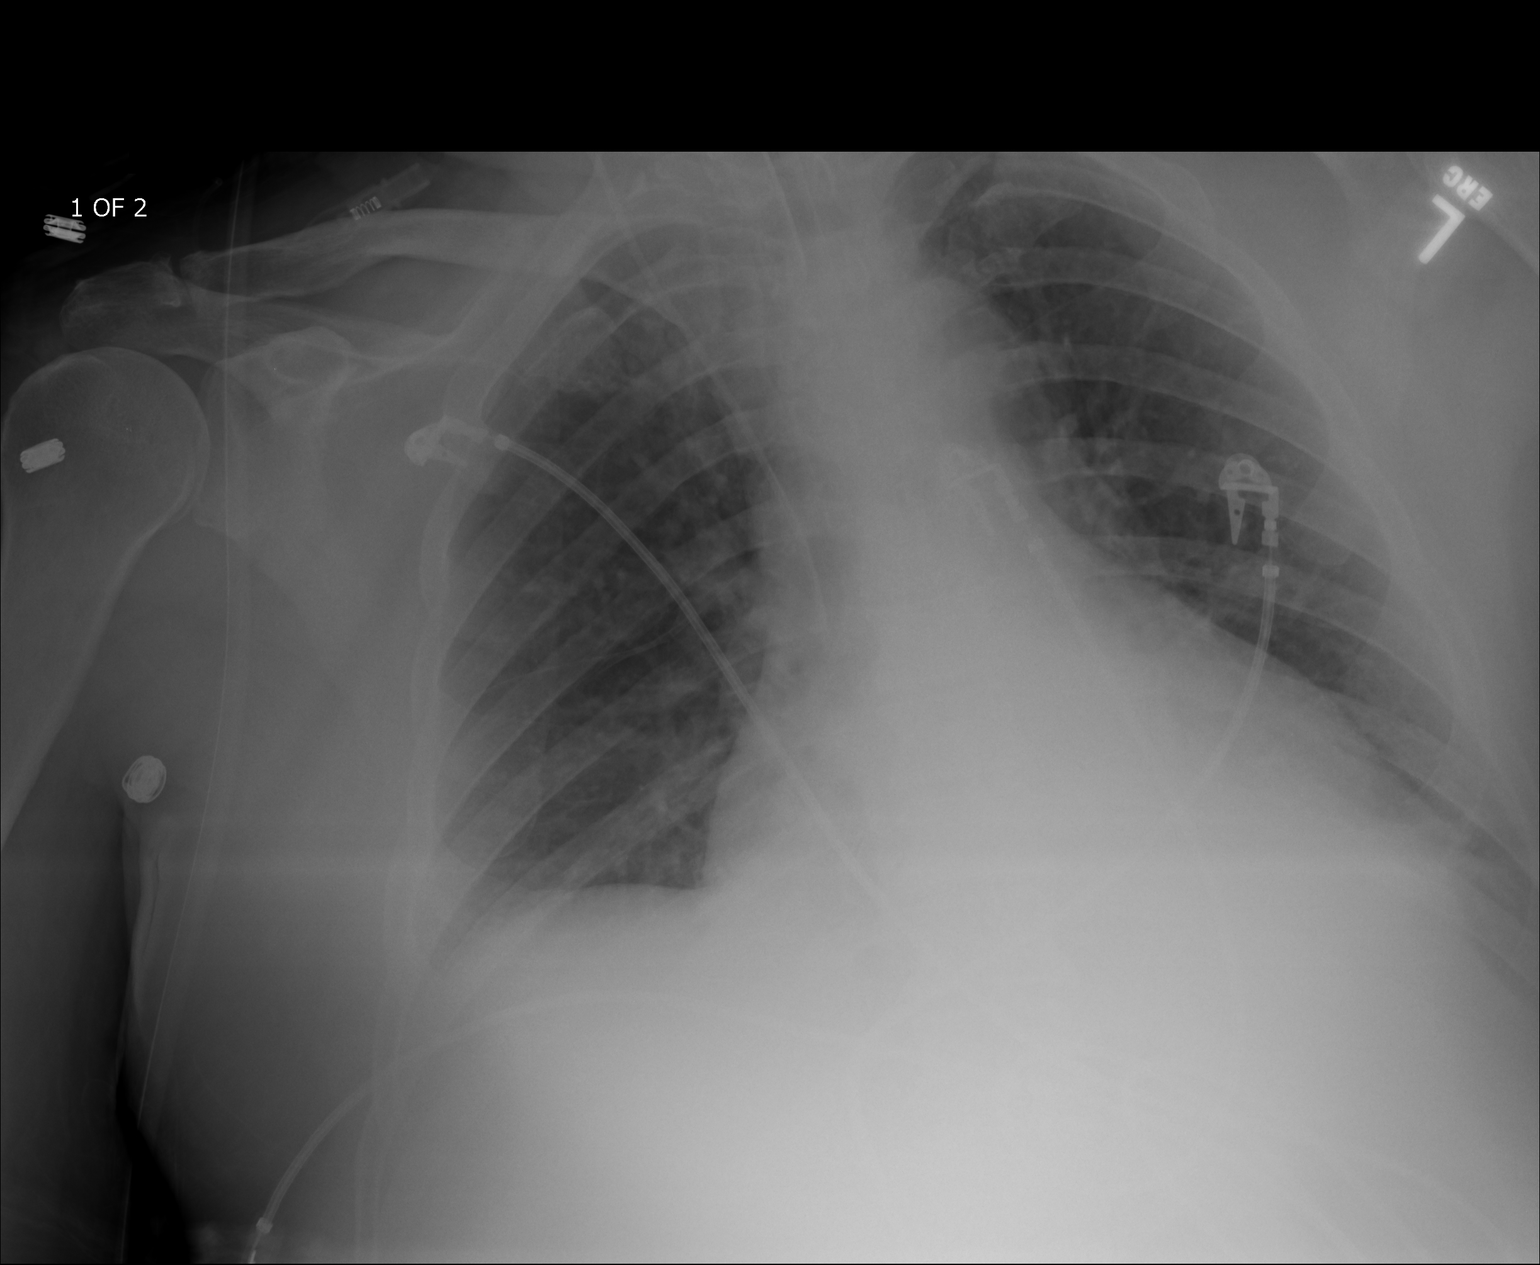
[im 2/2]
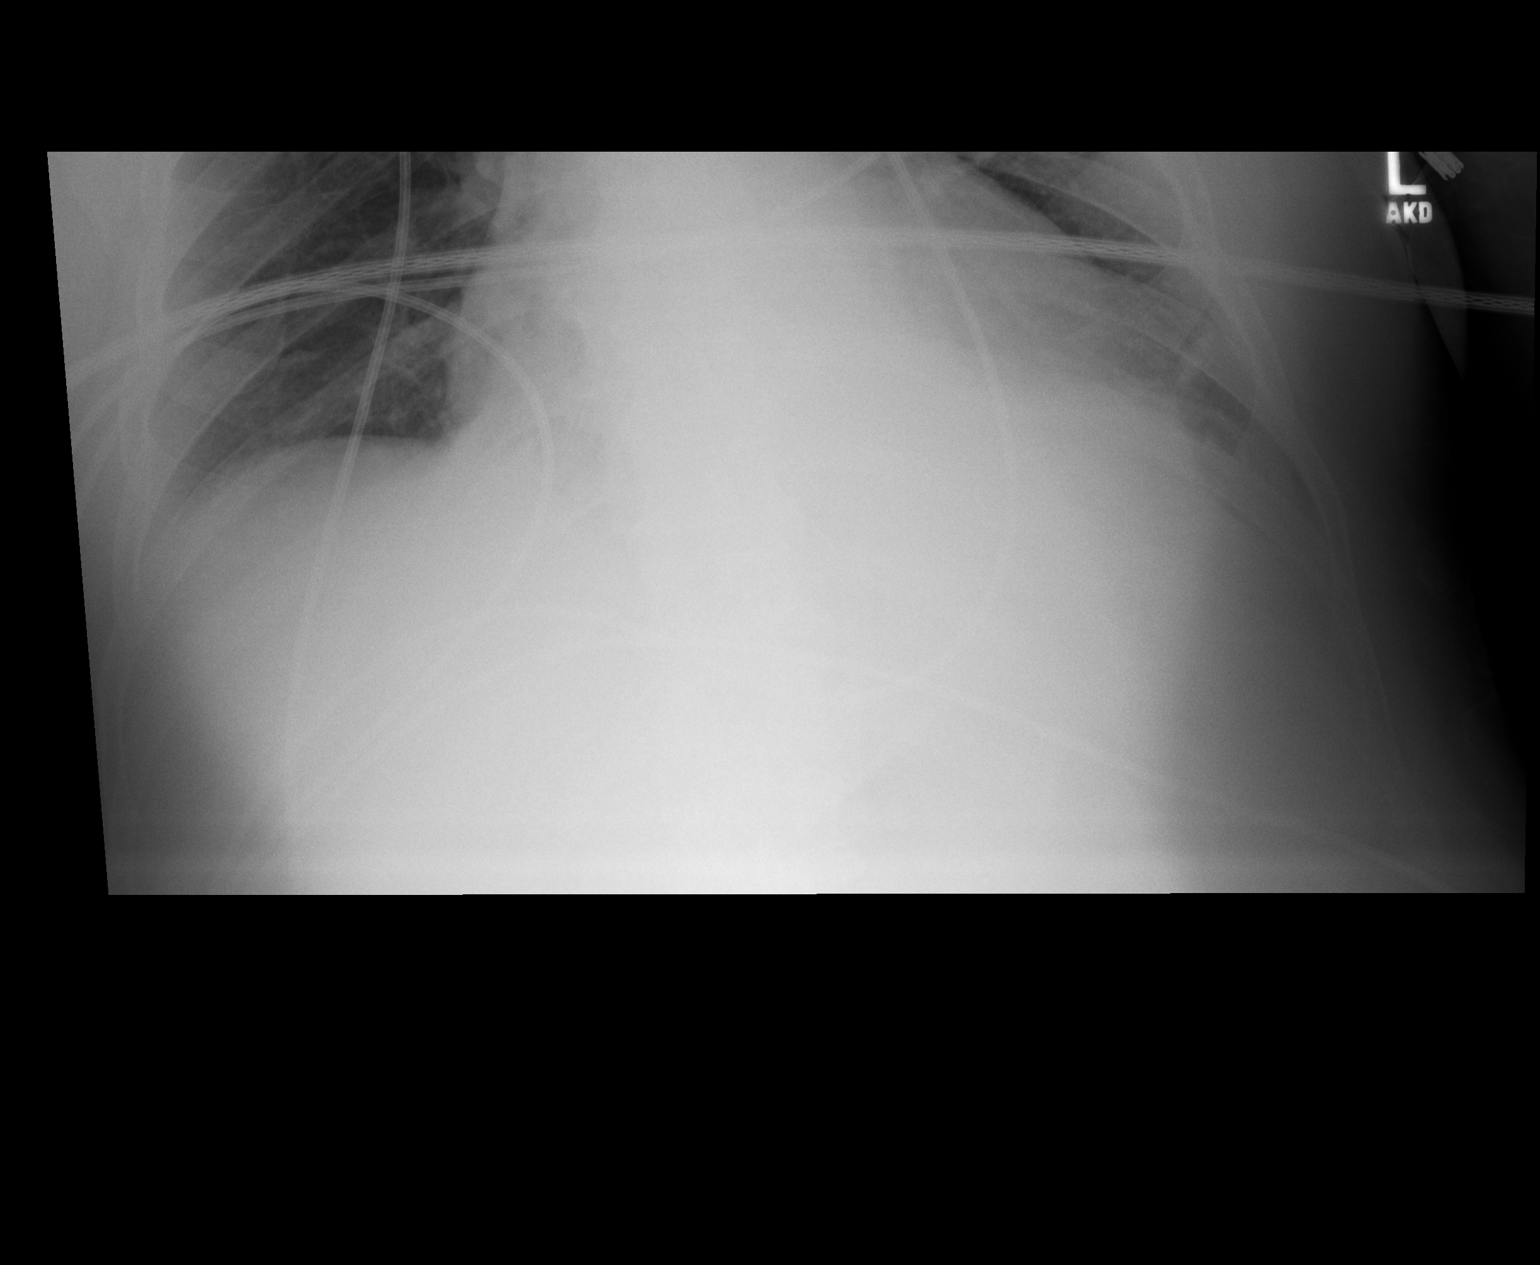

[2 of 2 positions shown; findings below may reference images not displayed]

PROCEDURE:     DXR - DXR PORTABLE CHEST SINGLE VIEW  - May 16, 2012  [DATE]

RESULT:     Comparison is made to the study 15 May, 2012. Endotracheal
tube remains at the level of the sternoclavicular joints. A right internal
jugular central venous catheters present with the tip at the junction of the
right atrium with the superior vena cava. A there is no edema, infiltrate,
effusion or pneumothorax. The cardiac silhouette is mildly enlarged but
unchanged.
IMPRESSION: 1. Cardiomegaly.

[REDACTED]

## 2014-03-09 IMAGING — CR DG CHEST 1V PORT
1 series · 1 of 1 positions shown · non-contrast
Comparison: 05/19/2012

CLINICAL DATA: Tracheostomy placement.

PORTABLE CHEST - 1 VIEW

[AP]
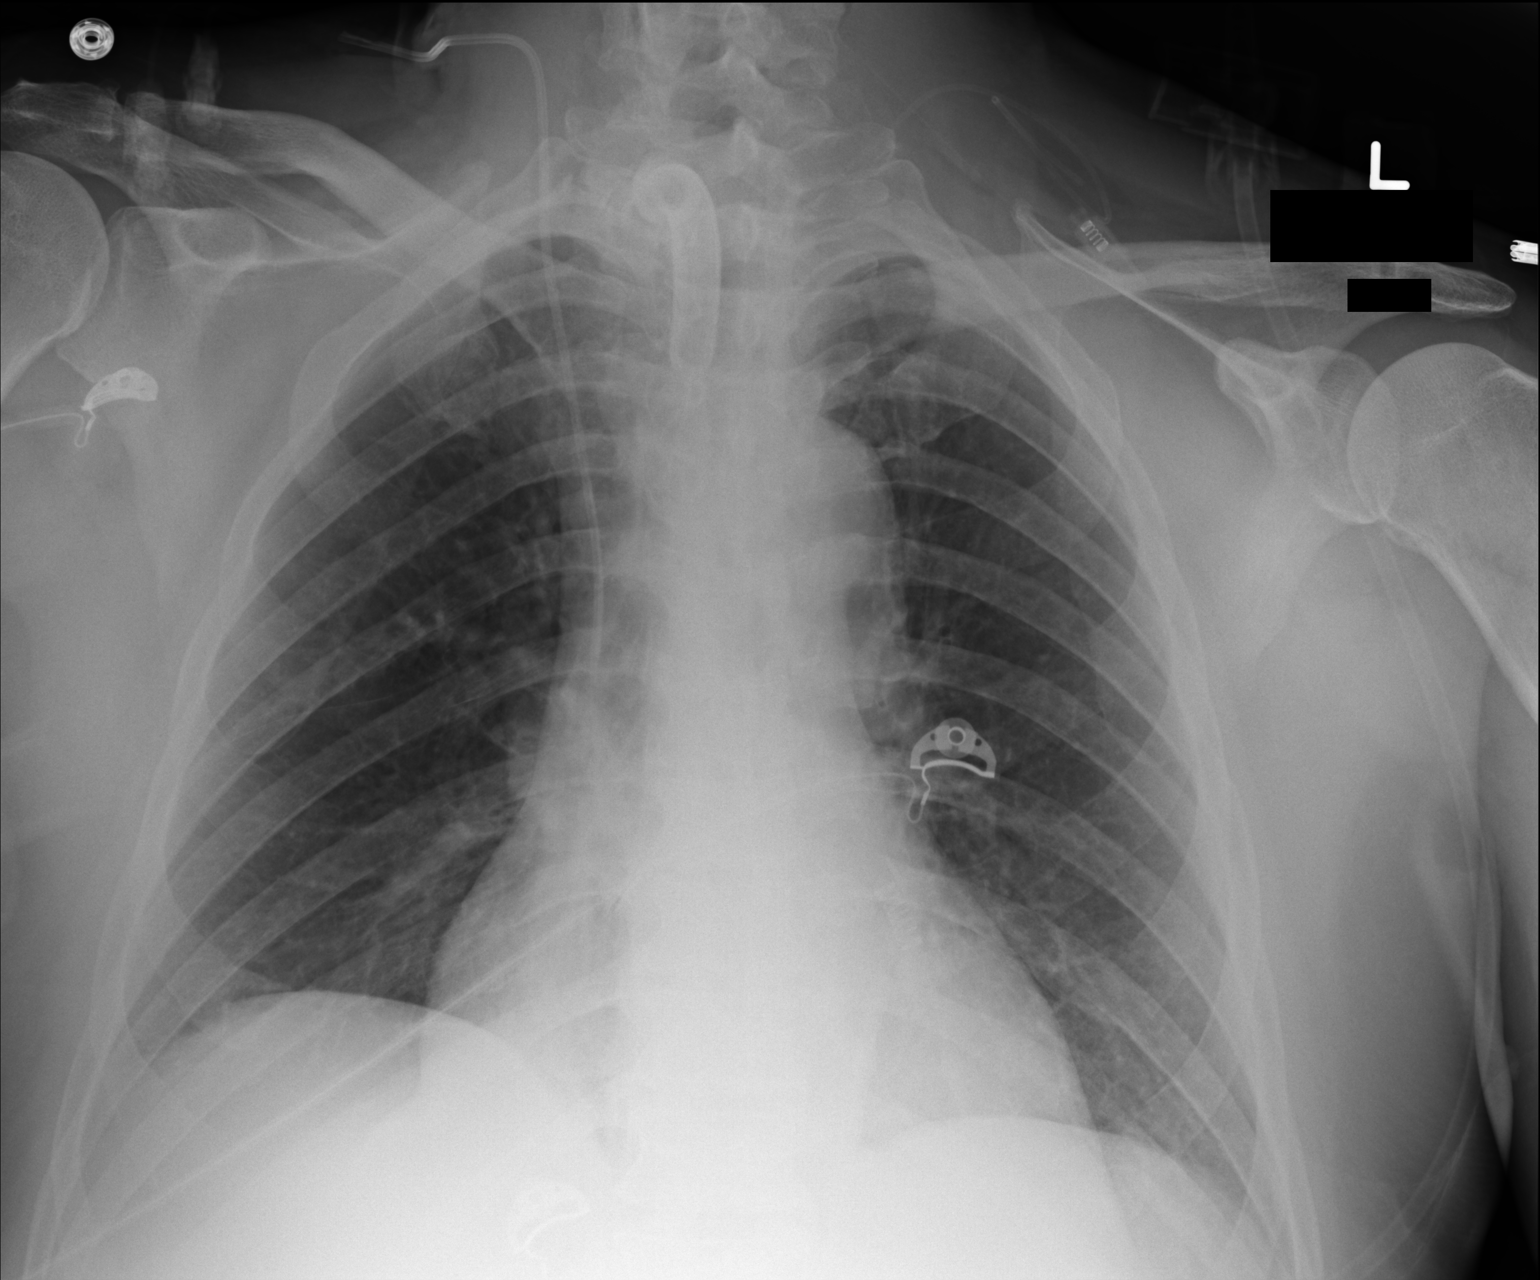

[1 of 1 positions shown; findings below may reference images not displayed]

FINDINGS: Tracheostomy catheter projects over the mid trachea.  No
pneumothorax.  Right central line tip is in the SVC, unchanged.
Heart is borderline in size.  Lungs are clear.  No acute bony
abnormality.
IMPRESSION: Tracheostomy placement.  No pneumothorax.  No active
cardiopulmonary disease.

## 2015-03-17 NOTE — Consult Note (Signed)
Referring Physician:  Ellin Saba :   Primary Care Physician:  Ellin Saba : [facility], [address] [place],  [address]  Reason for Consult:  Admit Date: 11-May-2012   Chief Complaint: altered mental status   History of Present Illness:  History of Present Illness:   69 yo RHD M presents to [facility] after being found down in his car by his neighbors.  They note that he is unresponsive and he is immediately brought here.  He was intubated for airway protection in the ER as he was thought to have a possible aspiration pneumonia.  No other new events.  ROS:   Review of Systems   unobtainable secondary to intubation  Past Medical/Surgical Hx:  denies:   denies:   Past Medical/ Surgical Hx:   Past Medical History CHF, Afib, HTN    Past Surgical History unobtainable   Home Medications: Medication Instructions Last Modified Date/Time  aspirin 81 mg oral tablet 1  orally once a day  14-Sep-11 15:30  Coumadin 7.5 mg oral tablet 1  orally once a day.   Have INR checked with follow-up wih Dr. Gilford Rile 14-Sep-11 15:31  carvedilol 6.25 mg oral tablet 1  orally 2 times a day  14-Sep-11 15:32  amiodarone 200 mg oral tablet 1  orally 2 times a day  14-Sep-11 15:32  furosemide 40 mg oral tablet 1  orally once a day  14-Sep-11 15:33  Gas-X   prn  06-Oct-11 07:03  enalapril 2.5 mg tablet 1 tab(s) orally 2 times a day x 30 days  06-Oct-11 07:04  Coumadin 5 mg tablet 1 tab(s) orally once a day x 30 days  06-Oct-11 07:04  digoxin 125 mcg (0.125 mg) tablet 1 tab(s) orally once a day x 30 days  06-Oct-11 07:06   Allergies:  No Known Allergies:   Social/Family History:  Employment Status: currently employed   Lives With: spouse   Living Arrangements: house   Social History: no tob, no illicits, rare EtOH per chart   Family History: unobtainable   Vital Signs: **Vital Signs.:   20-Jun-13 07:00   Pulse Pulse 116   Respirations Respirations  16   Systolic BP Systolic BP 97   Diastolic BP (mmHg) Diastolic BP (mmHg) 70   Mean BP 79   Pulse Ox % Pulse Ox % 96   Pulse Ox Heart Rate 90   Physical Exam:  General: appears younger than stated age even though intubated, slightly agitated, appropiate weight   HEENT: normocephalic, sclera nonicteric, oropharynx clear   Neck: supple, no JVD, no bruits,   Chest: CTA B, no wheezes, good movement   Cardiac: irregularly irregular with increased rate, no murmurs, 2+ pulses   Extremities: no C/C/E, FROM   Neurologic Exam:  Mental Status: intubated and mildly sedated, does not follow or open eyes, briskly responds to painful stimuli on L, GCS 7T   Cranial Nerves: pupils 78m B and reactive, strong L corneal but weak R,  good cough, intact Dolls   Motor Exam: spontaneous movement in L UE with brisk localizing, no movement in R UE, L>R LE move in response ot pain, nl tone   Deep Tendon Reflexes: 1+/4 symmetric, + Babinski on R, down plantar on L   Sensory Exam: does not really grimace to R sided pain as much as left   Coordination: untestable   Lab Results: Thyroid:  19-Jun-13 20:57    Thyroid Stimulating Hormone 2.62 (0.45-4.50 (International Unit)  -----------------------  Pregnant patients have  different reference  ranges for TSH:  - - - - - - - - - -  Pregnant, first trimetser:  0.36 - 2.50 uIU/mL)  Hepatic:  19-Jun-13 20:57    Bilirubin, Total  2.2   Alkaline Phosphatase 83   SGPT (ALT) 38 (12-78 NOTE: NEW REFERENCE RANGE 10/16/2011)   SGOT (AST)  38   Total Protein, Serum 7.1   Albumin, Serum 4.0  TDMs:  19-Jun-13 20:57    Digoxin, Serum < 0.06 (Therapeutic range for digoxin in patients with atrial fibrillation: 0.8 - 2.0 ng/mL. In patients with congestive heart failure a therapeutic range of 0.5 - 0.8 ng/mL is suggested as higher levels are associated with an increased risk of toxicity without clear evidence of enhanced efficacy. Digoxin toxicity is commonly  associated with serum levels > 2.0 ng/mL but may occur with lower levels, including those in the therapeutic range. Blood samples should be obtained 6-8 hours after administration to assure a reasonable volume of distribution.)  Routine Micro:  19-Jun-13 21:07    Specimen Source INDWELLING CATH    21:50    Micro Text Report BLOOD CULTURE   COMMENT                   NO GROWTH IN 8-12 HOURS   ANTIBIOTIC                        Culture Comment NO GROWTH IN 8-12 HOURS  Result(s) reported on 12 May 2012 at 06:46AM.  Routine Chem:  19-Jun-13 20:57    Glucose, Serum  163   BUN  21   Creatinine (comp)  1.71   Sodium, Serum 139   Potassium, Serum 4.3   Chloride, Serum 106   CO2, Serum  19   Calcium (Total), Serum 8.9   Osmolality (calc) 284   eGFR (African American)  47   eGFR (Non-African American)  41 (eGFR values <90m/min/1.73 m2 may be an indication of chronic kidney disease (CKD). Calculated eGFR is useful in patients with stable renal function. The eGFR calculation will not be reliable in acutely ill patients when serum creatinine is changing rapidly. It is not useful in  patients on dialysis. The eGFR calculation may not be applicable to patients at the low and high extremes of body sizes, pregnant women, and vegetarians.)   Anion Gap 14  20-Jun-13 04:35    Cholesterol, Serum 171   Triglycerides, Serum 90   HDL (INHOUSE)  28   VLDL Cholesterol Calculated 18   LDL Cholesterol Calculated  125 (Result(s) reported on 12 May 2012 at 05:22AM.)   Result Comment TROPONIN - RESULTS VERIFIED BY REPEAT TESTING.  - CALLED TO CYRA KUSSMAN@[id number] ,05/12/12  - READ-BACK PROCESS PERFORMED.  - TPL  Result(s) reported on 12 May 2012 at 05:32AM.  Cardiac:  19-Jun-13 20:57    CK, Total 104   CPK-MB, Serum 1.7 (Result(s) reported on 11 May 2012 at 09:51PM.)  20-Jun-13 04:35    Troponin I  0.39 (0.00-0.05 0.05 ng/mL or less: NEGATIVE  Repeat testing in 3-6 hrs  if clinically  indicated. >0.05 ng/mL: POTENTIAL  MYOCARDIAL INJURY. Repeat  testing in 3-6 hrs if  clinically indicated. NOTE: An increase or decrease  of 30% or more on serial  testing suggests a  clinically important change)  Routine UA:  19-Jun-13 21:07    Color (UA) Amber   Clarity (UA) Cloudy   Glucose (UA) 50 mg/dL   Bilirubin (  UA) Negative   Ketones (UA) Trace   Specific Gravity (UA) 1.018   Blood (UA) 1+   pH (UA) 6.0   Protein (UA) >=500   Nitrite (UA) Positive   Leukocyte Esterase (UA) 1+ (Result(s) reported on 11 May 2012 at 09:47PM.)   RBC (UA) 2 /HPF   WBC (UA) 84 /HPF   Bacteria (UA) 3+   Epithelial Cells (UA) NONE SEEN   Mucous (UA) PRESENT   Hyaline Cast (UA) 5 /LPF (Result(s) reported on 11 May 2012 at 09:47PM.)  Routine Coag:  19-Jun-13 20:57    Prothrombin 14.7   INR 1.1 (INR reference interval applies to patients on anticoagulant therapy. A single INR therapeutic range for coumarins is not optimal for all indications; however, the suggested range for most indications is 2.0 - 3.0. Exceptions to the INR Reference Range may include: Prosthetic heart valves, acute myocardial infarction, prevention of myocardial infarction, and combinations of aspirin and anticoagulant. The need for a higher or lower target INR must be assessed individually. Reference: The Pharmacology and Management of the Vitamin K  antagonists: the seventh ACCP Conference on Antithrombotic and Thrombolytic Therapy. PPIRJ.1884 Sept:126 (3suppl): N9146842. A HCT value >55% may artifactually increase the PT.  In one study,  the increase was an average of 25%. Reference:  "Effect on Routine and Special Coagulation Testing Values of Citrate Anticoagulant Adjustment in Patients with High HCT Values." American Journal of Clinical Pathology 2006;126:400-405.)  Routine Hem:  19-Jun-13 20:57    WBC (CBC)  14.4   RBC (CBC) 5.59   Hemoglobin (CBC) 16.2   Hematocrit (CBC) 49.3   Platelet Count (CBC) 155  (Result(s) reported on 11 May 2012 at 09:32PM.)   MCV 88   MCH 28.9   MCHC 32.8   RDW  14.9   Radiology Results: CT:    19-Jun-13 21:27, CT Head Without Contrast   CT Head Without Contrast    REASON FOR EXAM:    loc  COMMENTS:       PROCEDURE: CT  - CT HEAD WITHOUT CONTRAST  - May 11 2012  9:27PM     RESULT: Axial noncontrast CT scanning was performed through the brain   with reconstructions at 5 mm intervals and slice thicknesses.    There is mild diffuse cerebral atrophy consistent with the patient's age.   There is no intracranial hemorrhage nor intracranial mass effect. There   is no evidence of an evolving ischemic event. The cerebellum and   brainstem are normal in density.    At bone window settings there is a small amount of fluid and   mucoperiosteal thickening posteriorly in the left maxillary sinus. There     is soft tissue density material obstructing the posterior nasal passages,   the posterior nasopharynx, and the observed portions of the oropharynx.   There is an oral tracheal tube in place. There is no evidence of an acute   skull fracture.    IMPRESSION:    1. There is abnormal soft tissue density in the posterior aspect of the   nasal passages and in the posterior nasopharynx and visualized portions   of the oropharynx. There is a small amount of fluid and mucoperiosteal   thickening in the left maxillary sinus. The etiology for this is not   clear.  2. I do not see evidence of an acute ischemic or hemorrhagic infarction.   There is no intracranial mass effect nor hydrocephalus.     Dictation Site: 5  Verified By: DAVID A. Martinique, M.D., MD   Impression/Recommendations:  Recommendations:   labs reveiwed and showed increased LDL as well creatinine of head personally reviewed by me and show only a small portion of white matter changes d/w referring physician   Probable L MCA infarct-  this could be malignant by examination;  also on the differential  include Todd's paralysis from a focal seizure.  Doubt all other causes.  Pt has multiple risk factors for stroke as well. Atrial fibrillation-  not rate controled, unable to tell if pt is symptomatic because he is intubated Hyperlipidemia-  uncontrolled Congestive heart failure-  no signs on exam, echo pending agree with ASA 95m daily repeat CT of head today, can get MRI once extubated start lipitor 463mdaily no need to start anticoagulation right now may be aphasic so would not keep intubated secondary to not being able to follow commands will follow  Electronic Signatures: SmJamison NeighborMD)  (Signed 20-Jun-13 10:58)  Authored: REFERRING PHYSICIAN, Primary Care Physician, Consult, History of Present Illness, Review of Systems, PAST MEDICAL/SURGICAL HISTORY, HOME MEDICATIONS, ALLERGIES, Social/Family History, NURSING VITAL SIGNS, Physical Exam-, LAB RESULTS, RADIOLOGY RESULTS, Recommendations   Last Updated: 20-Jun-13 10:58 by SmJamison NeighborMD)

## 2015-03-17 NOTE — Discharge Summary (Signed)
PATIENT NAME:  Isaac Leonard, Isaac Leonard MR#:  [id number] DATE OF BIRTH:  Dec 18, 1945  DATE OF ADMISSION:  05/11/2012 DATE OF DISCHARGE:  05/16/2012  ADMISSION DIAGNOSIS: Acute cerebrovascular accident.   DISCHARGE DIAGNOSES:  1. 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  2. Acute respiratory failure secondary to problem #1. 3. Enterococcus urinary tract infection.  4. Atrial fibrillation.  5. Elevated troponin.  6. Cardiomyopathy.  7. Hypotension.  8. Hypokalemia.  9. Acute renal failure.  CONSULTANTS:  1. Mellody Drown, MD - Neurology. 2. Erin Fulling, MD - Pulmonary.  3. Julien Nordmann, MD - Cardiology.   LABORATORY, DIAGNOSTIC AND RADIOLOGIC DATA: INR 1.3. Sodium 143, potassium 3.4, chloride 108, bicarbonate 24, BUN 28, creatinine 2.16, and glucose 172. Magnesium 2.0. Phosphorus 3.3. Troponin 0.12. CK 1603. Troponin max 0.40.  Cholesterol 169, LDL 120, VLDL 20, HDL 99, and triglycerides 99.   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 Acute nonhemorrhagic left thalamic and left infratemporal infarct.   HOSPITAL COURSE: The patient is a 69 year old male who presented with right-sided weakness and respiratory failure. For details, please refer to the history and physical.  1. Acute respiratory failure secondary to acute cerebrovascular accident, increased work of breathing. The patient was intubated on admission; however, he did self-extubate. Due to increased work of breathing, he was reintubated on 05/13/2012. Vent medical management per pulmonary. He may need a trach.  2. Enterococcus urinary tract infection.  Blood cultures are negative to date. He was having fevers so vancomycin was started on 05/16/2012. A chest x-ray showed no evidence of pneumonia.  3. Acute basal ganglia left thalamic posterior limb cerebrovascular accident with completely occluded internal carotid  artery, likely embolic from atrial fibrillation. The patient is on Coumadin. Goal INR is 2 to 3. No heparin due to high likelihood of hemorrhagic conversion. Continue aspirin and statin. Appreciate Dr. Katrinka Blazing, from neurology, consultation.  4. Atrial fibrillation. The patient is currently on amiodarone and digoxin. Watching for blood pressure. Do not want to drop it too quickly due to the acute stroke. Heart rates are better controlled. 5. Elevated troponin from respiratory failure, atrial fibrillation, and acute cerebrovascular accident.  6. Cardiomyopathy, ejection fraction is less than 25%. Refused catheterization in the past so unknown if this is ischemic or nonischemic.  7. Hypotension, likely present on admission, from enterococcus urinary tract infection versus sedative medications versus hypovolemia. The patient is changed to Precedex. No evidence of pneumothorax by chest x-ray or vent settings. Add dopamine keeping maps greater than 65. No evidence of acute cardiac precipitating event. For hypotension he is on IV fluids. For sepsis and hypovolemia, watching for fluid overload due to the low ejection fraction.  8. We also started vancomycin for sepsis/Enterococcus UTI.  9. Hypokalemia. Repeating gently.  10. Acute renal failure, baseline chronic kidney disease, 1.5 to 1.7, stage III. Possibly worsening from poor perfusion due to hypotension and poor cardiac output. We are trying IV fluids.   CURRENT MEDICATIONS:  1. Precedex.  2. Dopamine drip.  3. Amiodarone drip.  4. Atorvastatin 40 mg daily.  5. Sliding scale insulin.  6. Coumadin 7.5 mg daily.  7. Vancomycin was changed to ampicillin and gentamicin.  8. Digoxin 0.125 mg IV daily.  9. Lovenox 40 mg every 24 hours.    DIET:  The patient is on Jevity 1.5 cal RTH 30 mL holding for residuals greater than 250.   DISPOSITION:  The patient will be transferred to [facility]. The patient is stable for  discharge. ____________________________ Jolan Upchurch P. Juliene PinaMody, MD spm:slb D: 05/16/2012 13:01:16 ET T: 05/16/2012 13:16:59 ET JOB#: [id number]  cc: Johanan Skorupski P. Juliene PinaMody, MD, <Dictator> Janyth ContesSITAL P Emerita Berkemeier MD ELECTRONICALLY SIGNED 05/17/2012 13:58

## 2015-03-17 NOTE — Consult Note (Signed)
General Aspect 69 year old Caucasian male with history of history of atrial fibrillation,  cardioversion by myself in October 2011, who since then in followup in early 2012 had stopped the amiodarone secondary to tremors in his hand, also stopping Coumadin as he remembered his wife having a difficult time with regulation of INR, lost to followup in 2012, no admissions to the hospital through this time , now presenting after being found down with possible stroke, right-sided deficits, currently intubated and sedated. On admission, he was noted to be in atrial fibrillation with RVR . Cardiology was consulted for atrial fibrillation and possible stroke .  INR on admission was subtherapeutic suggesting he was not taking warfarin. Uncertain how long he's been in atrial fibrillation as there are no recent EKGs. Prior echocardiogram showed ejection fraction was severely depressed at less than 25% with global hypokinesis. Transesophageal echo at the time of cardioversion suggested ejection fraction was moderately improved at 35-40%.   The patient usually takes care of his wife who is on dialysis. Notes indicate  that he took her back from dialysis session yesterday back to home, when he left home to his car to pick up something he did not come back until the wife after a couple of hours she started to scream and the neighbors heard her voice and upon coming home to check on her they found Isaac Leonard close to his car laying down on the ground and unresponsive. The son was called and the ambulance as well.   In the emergency room  there was some movement in the right foot but not the right arm. The patient was intubated for airway protection and connected to ventilator.    Present Illness . PAST MEDICAL HISTORY:  1. History of chronic atrial fibrillation, stopped amiodarone and warfarin in early 2012 2. cardiomyopathy ,  chronic systolic failure with ejection fraction of 35% by TEE October 2011. 3. Systemic  hypertension.   SOCIAL HABITS: Nonsmoker. No history of alcohol abuse.   FAMILY HISTORY: His father had prostatic cancer. His mother suffered from congestive heart failure.   SOCIAL HISTORY: Patient lives at home and he takes care of his wife who is on dialysis.   Physical Exam:   GEN well developed, no acute distress, Intubated    HEENT red conjunctivae    NECK supple    RESP normal resp effort  clear BS    CARD Irregular rate and rhythm  Tachycardic  No murmur    ABD denies tenderness  soft    LYMPH negative neck    EXTR negative edema    SKIN normal to palpation    NEURO Unable to test as he is sedated    PSYCH sedated   Review of Systems:   ROS Pt not able to provide ROS        Admit Reason:   CVA (cerebral infarction): (434.91) 11-May-2012, Active, ICD9, Unspecified cerebral artery occlusion with cerebral infarction  Home Medications: Medication Instructions Status  aspirin 81 mg oral tablet 1  orally once a day  Active  carvedilol 6.25 mg oral tablet 1  orally 2 times a day  Active  Gas-X   prn  Active  enalapril 2.5 mg tablet 1 tab(s) orally 2 times a day x 30 days  Active  Coumadin 5 mg tablet 1 tab(s) orally once a day x 30 days  Active   Lab Results:  Thyroid:  19-Jun-13 20:57    Thyroid Stimulating Hormone 2.62 (0.45-4.50 (International Unit)  -----------------------  Pregnant patients have  different reference  ranges for TSH:  - - - - - - - - - -  Pregnant, first trimetser:  0.36 - 2.50 uIU/mL)  Hepatic:  19-Jun-13 20:57    Bilirubin, Total  2.2   Alkaline Phosphatase 83   SGPT (ALT) 38 (12-78 NOTE: NEW REFERENCE RANGE 10/16/2011)   SGOT (AST)  38   Total Protein, Serum 7.1   Albumin, Serum 4.0  TDMs:  19-Jun-13 20:57    Digoxin, Serum < 0.06 (Therapeutic range for digoxin in patients with atrial fibrillation: 0.8 - 2.0 ng/mL. In patients with congestive heart failure a therapeutic range of 0.5 - 0.8 ng/mL is suggested as higher  levels are associated with an increased risk of toxicity without clear evidence of enhanced efficacy. Digoxin toxicity is commonly associated with serum levels > 2.0 ng/mL but may occur with lower levels, including those in the therapeutic range. Blood samples should be obtained 6-8 hours after administration to assure a reasonable volume of distribution.)  Routine Chem:  19-Jun-13 20:57    Glucose, Serum  163   BUN  21   Creatinine (comp)  1.71   Sodium, Serum 139   Potassium, Serum 4.3   Chloride, Serum 106   CO2, Serum  19   Calcium (Total), Serum 8.9   Osmolality (calc) 284   eGFR (African American)  47   eGFR (Non-African American)  41 (eGFR values <78m/min/1.73 m2 may be an indication of chronic kidney disease (CKD). Calculated eGFR is useful in patients with stable renal function. The eGFR calculation will not be reliable in acutely ill patients when serum creatinine is changing rapidly. It is not useful in  patients on dialysis. The eGFR calculation may not be applicable to patients at the low and high extremes of body sizes, pregnant women, and vegetarians.)   Anion Gap 14  20-Jun-13 04:35    Result Comment TROPONIN - RESULTS VERIFIED BY REPEAT TESTING.  - CALLED TO CYRA KUSSMAN@0531 ,05/12/12  - READ-BACK PROCESS PERFORMED.  - TPL  Result(s) reported on 12 May 2012 at 05:32AM.   Cholesterol, Serum 171   Triglycerides, Serum 90   HDL (INHOUSE)  28   VLDL Cholesterol Calculated 18   LDL Cholesterol Calculated  125 (Result(s) reported on 12 May 2012 at 05:22AM.)    13:51    Result Comment troponin - RESULTS VERIFIED BY REPEAT TESTING.  - prev. called 6/20/13at 0531 by [email or web address]  Result(s) reported on 12 May 2012 at 02:53PM.  Cardiac:  19-Jun-13 20:57    Troponin I 0.04 (0.00-0.05 0.05 ng/mL or less: NEGATIVE  Repeat testing in 3-6 hrs  if clinically indicated. >0.05 ng/mL: POTENTIAL  MYOCARDIAL INJURY. Repeat  testing in 3-6 hrs if  clinically  indicated. NOTE: An increase or decrease  of 30% or more on serial  testing suggests a  clinically important change)   CK, Total 104   CPK-MB, Serum 1.7 (Result(s) reported on 11 May 2012 at 09:51PM.)  20-Jun-13 04:35    Troponin I  0.39 (0.00-0.05 0.05 ng/mL or less: NEGATIVE  Repeat testing in 3-6 hrs  if clinically indicated. >0.05 ng/mL: POTENTIAL  MYOCARDIAL INJURY. Repeat  testing in 3-6 hrs if  clinically indicated. NOTE: An increase or decrease  of 30% or more on serial  testing suggests a  clinically important change)    13:51    Troponin I  0.42 (0.00-0.05 0.05 ng/mL or less: NEGATIVE  Repeat testing in 3-6 hrs  if clinically indicated. >0.05 ng/mL:  POTENTIAL  MYOCARDIAL INJURY. Repeat  testing in 3-6 hrs if  clinically indicated. NOTE: An increase or decrease  of 30% or more on serial  testing suggests a  clinically important change)  Routine Coag:  19-Jun-13 20:57    Prothrombin 14.7   INR 1.1 (INR reference interval applies to patients on anticoagulant therapy. A single INR therapeutic range for coumarins is not optimal for all indications; however, the suggested range for most indications is 2.0 - 3.0. Exceptions to the INR Reference Range may include: Prosthetic heart valves, acute myocardial infarction, prevention of myocardial infarction, and combinations of aspirin and anticoagulant. The need for a higher or lower target INR must be assessed individually. Reference: The Pharmacology and Management of the Vitamin K  antagonists: the seventh ACCP Conference on Antithrombotic and Thrombolytic Therapy. WCBJS.2831 Sept:126 (3suppl): [id number]. A HCT value >55% may artifactually increase the PT.  In one study,  the increase was an average of 25%. Reference:  "Effect on Routine and Special Coagulation Testing Values of Citrate Anticoagulant Adjustment in Patients with High HCT Values." American Journal of Clinical Pathology 2006;126:400-405.)  Routine  Hem:  19-Jun-13 20:57    WBC (CBC)  14.4   RBC (CBC) 5.59   Hemoglobin (CBC) 16.2   Hematocrit (CBC) 49.3   Platelet Count (CBC) 155 (Result(s) reported on 11 May 2012 at 09:32PM.)   MCV 88   MCH 28.9   MCHC 32.8   RDW  14.9   EKG:   Interpretation EKG shows atrial fibrillation with ventricular rate 140 beats per minute, nonspecific ST abnormality    No Known Allergies:   Vital Signs/Nurse's Notes: **Vital Signs.:   20-Jun-13 12:00   Temperature Temperature (F) 99.3   Celsius 37.3   Temperature Source oral   Pulse Pulse 112   Respirations Respirations 16   Systolic BP Systolic BP 517   Diastolic BP (mmHg) Diastolic BP (mmHg) 81   Mean BP 89   Pulse Ox % Pulse Ox % 94   Pulse Ox Activity Level  At rest   Oxygen Delivery Ventilator Assisted   Pulse Ox Heart Rate 60     Impression 69 year old Caucasian male with history of history of atrial fibrillation,  cardioversion by myself in October 2011, who since then was lost to  followup since 11/2010, had stopped the amiodarone secondary to tremors in his hand, also stopping Coumadin,  no admissions to the hospital through 2012 and he was not seen in clinic all of last year, now presenting after being found down with possible stroke, right-sided deficits, currently intubated and sedated.  noted to be in atrial fibrillation with RVR .   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  2) atrial fibrillation Amiodarone discontinued as he likely has cardiac thrombus, and uncertain how long he has been in atrial fibrillation. Do not want to risk chance of conversion to normal sinus rhythm at this time.  --Will use digoxin by mouth or IV for rate control --Would continue low-dose Coreg as blood pressure tolerates. --Would need TEE prior to starting amiodarone or cardioversion.  3) cardiomyopathy with  history of systolic CHF Suspected to be nonischemic, possibly tachycardia mediated. He has refused cardiac catheterization in the past to definitively exclude coronary artery disease. Ejection fraction 35-40% in October 2011 by TEE --Repeat echocardiogram pending --Will titrate indications as tolerated including beta blockers, diuretics, will hold on  ACE inhibitors  4) chronic renal insufficiency History of chronic kidney disease Appears mildly increased from prior lab studies with creatinine 1.45 in September 2011, creatinine 0.92 in October 2011   Electronic Signatures: Ida Rogue (MD)  (Signed 20-Jun-13 18:21)  Authored: General Aspect/Present Illness, History and Physical Exam, Review of System, Past Medical History, Health Issues, Home Medications, Labs, EKG , Allergies, Vital Signs/Nurse's Notes, Impression/Plan   Last Updated: 20-Jun-13 18:21 by Ida Rogue (MD)

## 2015-03-17 NOTE — H&P (Signed)
PATIENT NAME:  Isaac Leonard, Isaac Leonard MR#:  [id number] DATE OF BIRTH:  [birth year]  DATE OF ADMISSION:  05/11/2012  PRIMARY CARE PHYSICIAN: Dr. Ronna PolioJennifer Walker    CHIEF COMPLAINT: Unresponsiveness.   HISTORY OF PRESENT ILLNESS: Isaac Leonard is a 69 year old Caucasian male with history of history of atrial fibrillation maintained on chronic anticoagulation with Coumadin and history of congestive heart failure with systolic ejection fraction at 04%25%. The patient usually takes care of his wife who is on dialysis and the story is that he took her back from dialysis session today back to home then when he left home to his car to pick up something he did not come back until the wife after a couple of hours she started to scream and the neighbors heard her voice and upon coming home to check on her they found Isaac Leonard close to his car laying down on the ground and unresponsive. The son was called and the ambulance as well. Patient transported here and he was notice that he was not moving the right side of his body. Nevertheless, while he was in the Emergency Department there was some movement in the right foot but not the right arm. The patient was intubated for airway protection and connected to ventilator.   REVIEW OF SYSTEMS: Unobtainable due to patient being on ventilator and he is on sedation as well.   PAST MEDICAL HISTORY:  1. History of chronic atrial fibrillation, on chronic anticoagulation with Coumadin.  2. Congestive heart failure which is chronic systolic failure with ejection fraction of 25%. 3. Systemic hypertension.   SOCIAL HABITS: Nonsmoker. No history of alcohol abuse.   FAMILY HISTORY: His father had prostatic cancer. His mother suffered from congestive heart failure.   SOCIAL HISTORY: Patient lives at home and he takes care of his wife who is on dialysis.   ADMISSION MEDICATIONS:  1. Amiodarone 200 mg twice a day, that is 400 mg total daily dose.  2. Aspirin 81 mg a day.  3. Carvedilol  6.25 mg twice a day.  4. Coumadin there are two doses; one is 5 mg, the other is 7.5 mg. I spoke to the son. He told me that he is not very compliant with his medications. Indeed his prothrombin time is subtherapeutic.  5. Digoxin 0.125 mg once a day. 6. Enalapril 2.5 mg twice a day. 7. Furosemide 40 mg a day.   ALLERGIES: No known drug allergies.   PHYSICAL EXAMINATION:  VITAL SIGNS: Blood pressure 157/109, respiratory rate 14, pulse 128, temperature 100.1, oxygen saturation 99%. Patient is on ventilator.   GENERAL APPEARANCE: Elderly male, sedated on ventilator. Unresponsive.   HEAD/NECK: No pallor. No icterus. No cyanosis.   ENT: Hearing cannot be assessed, patient is unresponsive. Nasal mucosa, lips, tongue were normal. There is little fresh bleeding that had crusted from the left nose.    EYES: Normal eyelids and conjunctivae. Pupils are pinpointed but they are equal. Could not detect reactivity to light.   NECK: Supple. Trachea at midline. No thyromegaly. No cervical lymphadenopathy.   HEART: Regular S1, S2. No S3, S4. No murmur. No gallop. No carotid bruits.   RESPIRATORY: Normal breathing pattern while he is on ventilator. There are coarse rhonchi in both lungs. No rales. No wheezing. Patient is not using accessory muscles. He is totally dependent on the ventilator to breathe.   ABDOMEN: Soft without tenderness. No hepatosplenomegaly. No masses. No hernias. No rigidity.   SKIN: No ulcers. No subcutaneous nodules.   MUSCULOSKELETAL: No  joint swelling. No clubbing.   NEUROLOGIC: The patient is unresponsive. He moves his left side. On the right side there is only movement of the right foot. There is no facial asymmetry. Pupils are pinpointed and equal. Plantar responses showed he is withdrawing on both sides. Babinski was negative.   PSYCHIATRIC: Cannot be examined or obtained as the patient is unresponsive.   LABORATORY, DIAGNOSTIC, AND RADIOLOGICAL DATA: Serum glucose 163,  BUN 21, creatinine 1.7, sodium 139, potassium 4.3, bicarbonate 19. Liver function tests were normal except for elevated bilirubin at 2.2. He had elevated bilirubin in 2011. Troponin 0.04. TSH 2.62. CBC showed elevated white count at 14,000, hemoglobin 16, hematocrit 49, platelet count 155. Prothrombin time 14, INR 1.1. Urinalysis showed cloudy urine, more than 500 of protein, 84 white blood cells, +3 bacteria. ABG showed pH 7.36, pCO2 33, pO2 129. CAT scan of the head showed no evidence of acute ischemic stroke or hemorrhagic infarction. There is abnormal soft tissue density in the posterior aspect of the nasal passages and in the posterior nasopharynx. CT scan of the abdomen and pelvis showed bibasilar atelectasis or pneumonia. Small bilateral pleural effusions. Incidental finding of gallstones present.   ASSESSMENT:  1. Unresponsiveness likely secondary to acute stroke. The culprit probably the atrial fibrillation since he is not protected with anticoagulation with subtherapeutic Coumadin.  2. Chronic atrial fibrillation with rapid ventricular rate.  3. Chronic anticoagulation with Coumadin with subtherapeutic level. 4. Urinary tract infection.  5. Pulmonary infiltrates at the upper lobes either secondary to fluid overload or congestion versus infiltrates or aspiration.  6. Cholelithiasis.  7. Systemic hypertension.  8. Congestive cardiomyopathy with chronic systolic heart failure with ejection fraction of 25%.   PLAN: Patient will be admitted to the Intensive Care Unit. He will be kept on ventilator, IV antibiotic using Rocephin along with Zithromax to cover for possible pneumonia although likely it could be congestion given his congestive heart failure state. Antibiotic also will cover for urinary tract infection. Blood cultures x2 and urine culture were already taken. Intravenous metoprolol was given to control the ventricular rate. I will continue Coreg and enalapril. Continue amiodarone.  Intravenous Lasix to reduce pulmonary vascular congestion. Repeat chest x-ray tomorrow to further evaluate the upper lobe densities to ensure whether this is congestive heart failure versus pneumonia. Check arterial blood gas in the morning. Patient is now sedated on intravenous Versed drip. I discussed the whole clinical presentation with the son and the findings and I answered his questions. He is unsure whether Mr. River has a LIVING WILL or not but he will check on this issue. He tells me that he had appointed him to have the power of attorney. His son's name is Edgerrin Correia.   TIME SPENT EVALUATING THIS PATIENT: Took more than 1 hour and 20 minutes.   ____________________________ Carney Corners. Rudene Re, MD amd:cms D: 05/11/2012 23:51:54 ET T: 05/12/2012 07:02:17 ET JOB#: [id number]  cc: Carney Corners. Rudene Re, MD, <Dictator> Ginette Pitman. Dan Humphreys, MD  Carney Corners Brylan Dec MD ELECTRONICALLY SIGNED 05/20/2012 3:35
